# Patient Record
Sex: Male | Born: 1985 | Race: Black or African American | Hispanic: No | Marital: Single | Smoking: Current every day smoker
Health system: Southern US, Community
[De-identification: ages and names within clinical notes are randomized; demographics above are authoritative.]

## PROBLEM LIST (undated history)

## (undated) DIAGNOSIS — F1010 Alcohol abuse, uncomplicated: Secondary | ICD-10-CM

## (undated) DIAGNOSIS — S129XXA Fracture of neck, unspecified, initial encounter: Secondary | ICD-10-CM

---

## 2010-02-01 ENCOUNTER — Encounter: Admission: RE | Admit: 2010-02-01 | Discharge: 2010-02-01 | Payer: Self-pay | Admitting: Infectious Diseases

## 2010-02-01 IMAGING — CR DG CHEST 2V
2 series · 2 of 2 positions shown · non-contrast
Comparison: None.

CLINICAL DATA: Asymptomatic positive PPD.

CHEST - 2 VIEW [DATE]:

[view not recorded (1 of 2)]
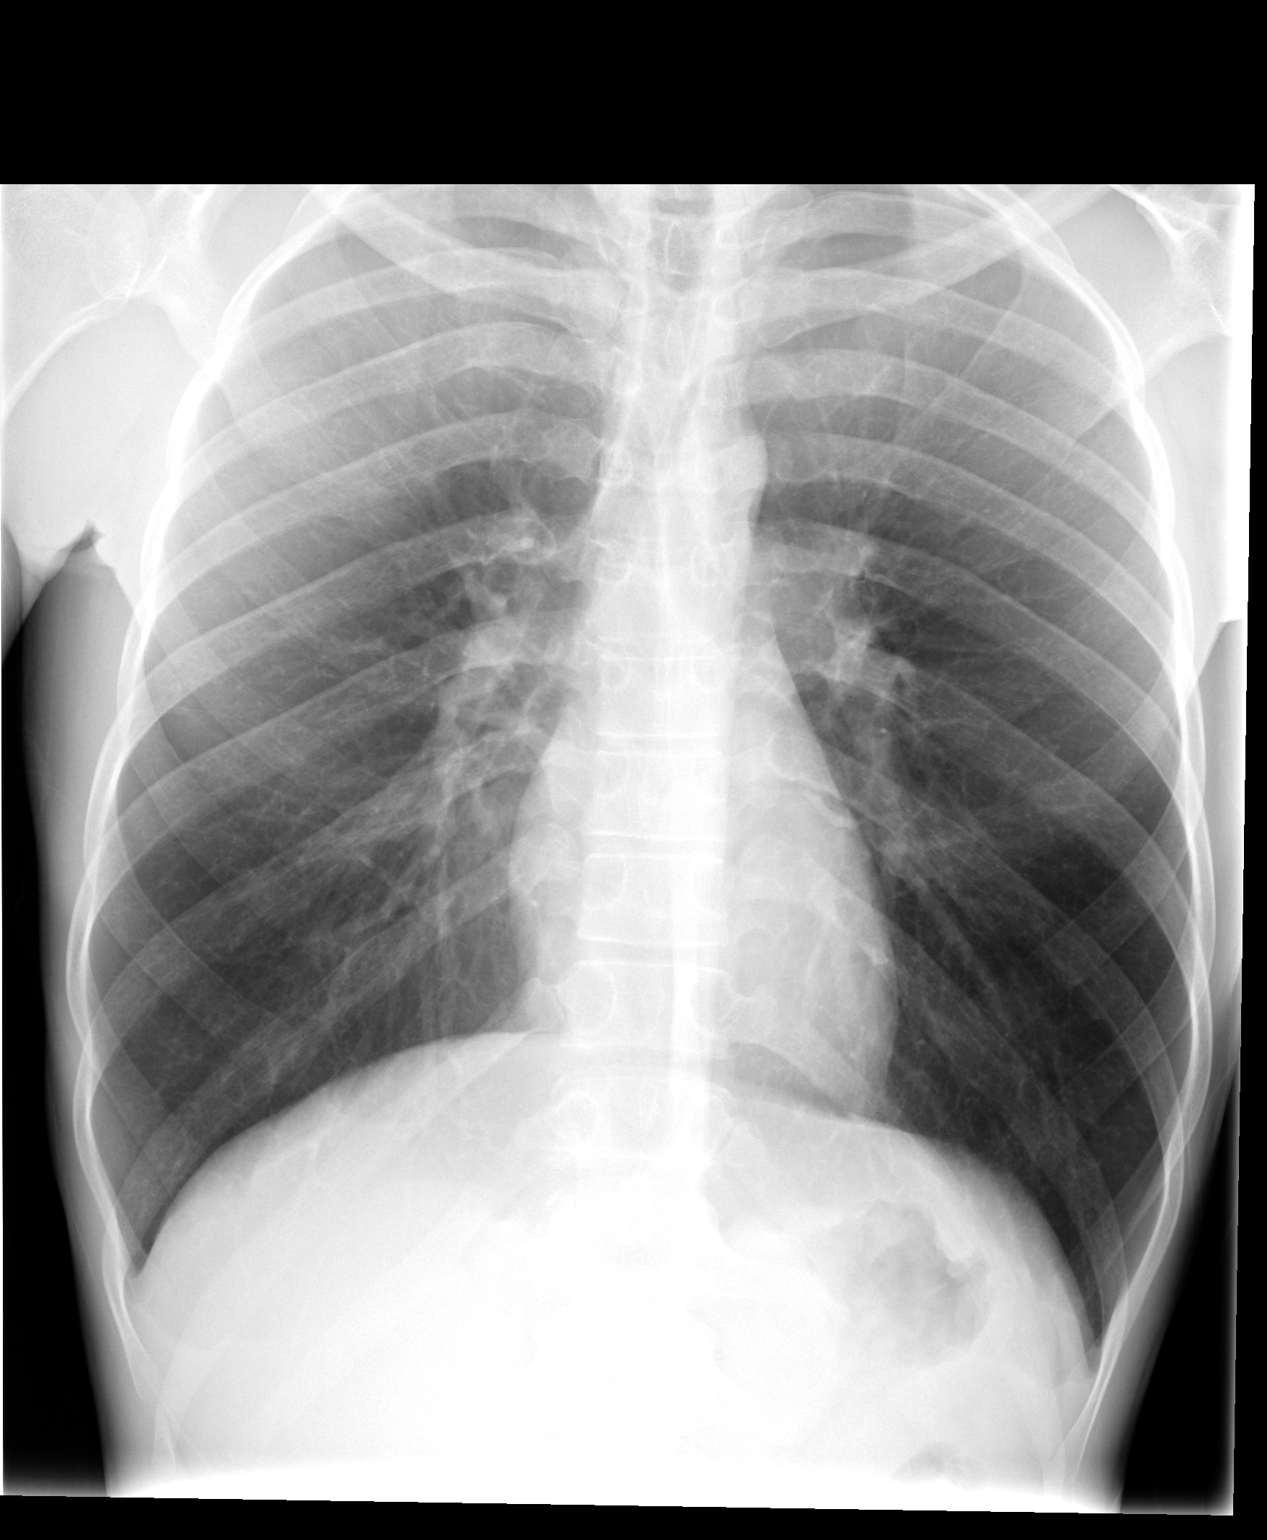

[view not recorded (2 of 2)]
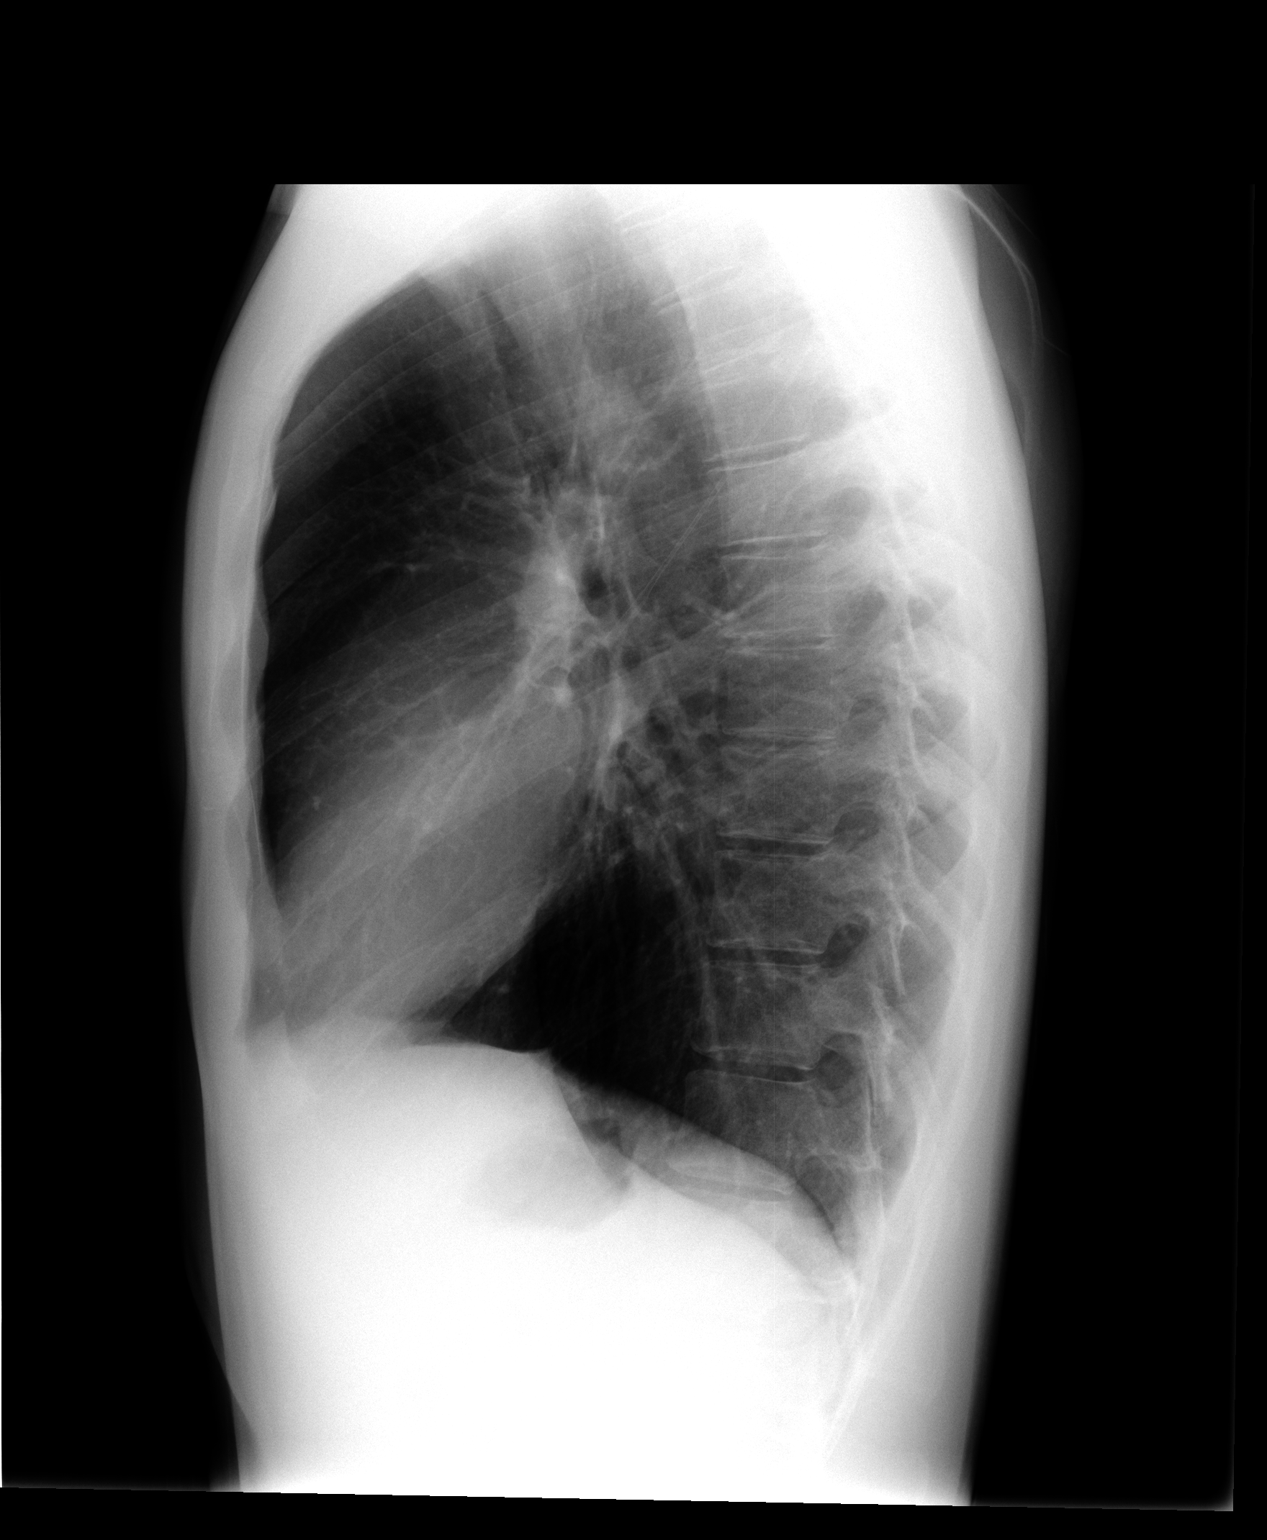

[2 of 2 positions shown; findings below may reference images not displayed]

FINDINGS: Cardiomediastinal silhouette unremarkable.  Lungs clear.
Bronchovascular markings normal.  Pulmonary vascularity normal.  No
pleural effusions.  No pneumothorax.  Visualized bony thorax
intact.
IMPRESSION: Normal chest x-ray.

## 2016-08-19 ENCOUNTER — Encounter (HOSPITAL_COMMUNITY): Payer: Self-pay | Admitting: Emergency Medicine

## 2016-08-19 ENCOUNTER — Other Ambulatory Visit: Payer: Self-pay | Admitting: Neurosurgery

## 2016-08-19 ENCOUNTER — Inpatient Hospital Stay (HOSPITAL_COMMUNITY)
Admission: EM | Admit: 2016-08-19 | Discharge: 2016-08-23 | DRG: 472 | Disposition: A | Discharge status: Home / Self Care | Payer: No Typology Code available for payment source | Attending: Neurosurgery | Admitting: Neurosurgery

## 2016-08-19 ENCOUNTER — Emergency Department (HOSPITAL_COMMUNITY): Payer: No Typology Code available for payment source

## 2016-08-19 ENCOUNTER — Inpatient Hospital Stay (HOSPITAL_COMMUNITY): Payer: No Typology Code available for payment source

## 2016-08-19 DIAGNOSIS — S12400A Unspecified displaced fracture of fifth cervical vertebra, initial encounter for closed fracture: Secondary | ICD-10-CM | POA: Diagnosis present

## 2016-08-19 DIAGNOSIS — T148XXA Other injury of unspecified body region, initial encounter: Secondary | ICD-10-CM

## 2016-08-19 DIAGNOSIS — S129XXA Fracture of neck, unspecified, initial encounter: Secondary | ICD-10-CM | POA: Diagnosis present

## 2016-08-19 DIAGNOSIS — S0083XA Contusion of other part of head, initial encounter: Secondary | ICD-10-CM

## 2016-08-19 DIAGNOSIS — D62 Acute posthemorrhagic anemia: Secondary | ICD-10-CM | POA: Diagnosis present

## 2016-08-19 DIAGNOSIS — Y92410 Unspecified street and highway as the place of occurrence of the external cause: Secondary | ICD-10-CM

## 2016-08-19 DIAGNOSIS — Y908 Blood alcohol level of 240 mg/100 ml or more: Secondary | ICD-10-CM | POA: Diagnosis present

## 2016-08-19 DIAGNOSIS — F1010 Alcohol abuse, uncomplicated: Secondary | ICD-10-CM | POA: Diagnosis present

## 2016-08-19 DIAGNOSIS — N289 Disorder of kidney and ureter, unspecified: Secondary | ICD-10-CM

## 2016-08-19 DIAGNOSIS — S0081XA Abrasion of other part of head, initial encounter: Secondary | ICD-10-CM | POA: Diagnosis present

## 2016-08-19 DIAGNOSIS — S12300A Unspecified displaced fracture of fourth cervical vertebra, initial encounter for closed fracture: Secondary | ICD-10-CM | POA: Diagnosis present

## 2016-08-19 DIAGNOSIS — F10129 Alcohol abuse with intoxication, unspecified: Secondary | ICD-10-CM | POA: Diagnosis present

## 2016-08-19 DIAGNOSIS — S129XXD Fracture of neck, unspecified, subsequent encounter: Secondary | ICD-10-CM

## 2016-08-19 DIAGNOSIS — S0181XA Laceration without foreign body of other part of head, initial encounter: Secondary | ICD-10-CM | POA: Diagnosis present

## 2016-08-19 DIAGNOSIS — R29898 Other symptoms and signs involving the musculoskeletal system: Secondary | ICD-10-CM

## 2016-08-19 DIAGNOSIS — M5412 Radiculopathy, cervical region: Secondary | ICD-10-CM | POA: Diagnosis present

## 2016-08-19 HISTORY — DX: Fracture of neck, unspecified, initial encounter: S12.9XXA

## 2016-08-19 LAB — URINALYSIS, ROUTINE W REFLEX MICROSCOPIC
Bacteria, UA: NONE SEEN
Bilirubin Urine: NEGATIVE
Glucose, UA: NEGATIVE mg/dL
Hgb urine dipstick: NEGATIVE
Ketones, ur: 20 mg/dL — AB
Leukocytes, UA: NEGATIVE
Nitrite: NEGATIVE
Protein, ur: NEGATIVE mg/dL
Specific Gravity, Urine: >1.046 — ABNORMAL HIGH (ref 1.005–1.030)
Squamous Epithelial / LPF: NONE SEEN
pH: 6 (ref 5.0–8.0)

## 2016-08-19 LAB — PROTIME-INR
INR: 1.07
PROTHROMBIN TIME: 13.9 s (ref 11.4–15.2)

## 2016-08-19 LAB — CBC
HCT: 41.5 % (ref 39.0–52.0)
HCT: 37.4 % — ABNORMAL LOW (ref 39.0–52.0)
HCT: 37.9 % — ABNORMAL LOW (ref 39.0–52.0)
HEMOGLOBIN: 13 g/dL (ref 13.0–17.0)
Hemoglobin: 15 g/dL (ref 13.0–17.0)
Hemoglobin: 13.1 g/dL (ref 13.0–17.0)
MCH: 31.1 pg (ref 26.0–34.0)
MCH: 29.4 pg (ref 26.0–34.0)
MCH: 29.7 pg (ref 26.0–34.0)
MCHC: 36.1 g/dL — ABNORMAL HIGH (ref 30.0–36.0)
MCHC: 34.8 g/dL (ref 30.0–36.0)
MCHC: 34.6 g/dL (ref 30.0–36.0)
MCV: 85.9 fL (ref 78.0–100.0)
MCV: 84.6 fL (ref 78.0–100.0)
MCV: 85.9 fL (ref 78.0–100.0)
PLATELETS: 151 10*3/uL (ref 150–400)
PLATELETS: 139 10*3/uL — AB (ref 150–400)
Platelets: 102 10*3/uL — ABNORMAL LOW (ref 150–400)
RBC: 4.83 MIL/uL (ref 4.22–5.81)
RBC: 4.42 MIL/uL (ref 4.22–5.81)
RBC: 4.41 MIL/uL (ref 4.22–5.81)
RDW: 12.9 % (ref 11.5–15.5)
RDW: 12.6 % (ref 11.5–15.5)
RDW: 12.9 % (ref 11.5–15.5)
WBC: 8.1 10*3/uL (ref 4.0–10.5)
WBC: 10.8 10*3/uL — ABNORMAL HIGH (ref 4.0–10.5)
WBC: 8.6 10*3/uL (ref 4.0–10.5)

## 2016-08-19 LAB — BASIC METABOLIC PANEL
Anion gap: 13 (ref 5–15)
BUN: 16 mg/dL (ref 6–20)
CALCIUM: 9.5 mg/dL (ref 8.9–10.3)
CHLORIDE: 112 mmol/L — AB (ref 101–111)
CO2: 19 mmol/L — ABNORMAL LOW (ref 22–32)
CREATININE: 1.24 mg/dL (ref 0.61–1.24)
GFR calc non Af Amer: >60 mL/min (ref >60)
Glucose, Bld: 93 mg/dL (ref 65–99)
Potassium: 4.2 mmol/L (ref 3.5–5.1)
SODIUM: 144 mmol/L (ref 135–145)

## 2016-08-19 LAB — I-STAT CHEM 8, ED
BUN: 20 mg/dL (ref 6–20)
Calcium, Ion: 1.08 mmol/L — ABNORMAL LOW (ref 1.15–1.40)
Chloride: 104 mmol/L (ref 101–111)
Creatinine, Ser: 1.7 mg/dL — ABNORMAL HIGH (ref 0.61–1.24)
Glucose, Bld: 101 mg/dL — ABNORMAL HIGH (ref 65–99)
HCT: 45 % (ref 39.0–52.0)
Hemoglobin: 15.3 g/dL (ref 13.0–17.0)
Potassium: 3.9 mmol/L (ref 3.5–5.1)
Sodium: 145 mmol/L (ref 135–145)
TCO2: 26 mmol/L (ref 0–100)

## 2016-08-19 LAB — COMPREHENSIVE METABOLIC PANEL
ALT: 59 U/L (ref 17–63)
AST: 181 U/L — ABNORMAL HIGH (ref 15–41)
Albumin: 4.2 g/dL (ref 3.5–5.0)
Alkaline Phosphatase: 68 U/L (ref 38–126)
Anion gap: 16 — ABNORMAL HIGH (ref 5–15)
BUN: 16 mg/dL (ref 6–20)
CO2: 21 mmol/L — ABNORMAL LOW (ref 22–32)
Calcium: 9.3 mg/dL (ref 8.9–10.3)
Chloride: 105 mmol/L (ref 101–111)
Creatinine, Ser: 1.43 mg/dL — ABNORMAL HIGH (ref 0.61–1.24)
GFR calc Af Amer: 33 mL/min — ABNORMAL LOW (ref >60)
GFR calc non Af Amer: 29 mL/min — ABNORMAL LOW (ref >60)
Glucose, Bld: 95 mg/dL (ref 65–99)
Potassium: 3.8 mmol/L (ref 3.5–5.1)
Sodium: 142 mmol/L (ref 135–145)
Total Bilirubin: 0.4 mg/dL (ref 0.3–1.2)
Total Protein: 7.3 g/dL (ref 6.5–8.1)

## 2016-08-19 LAB — I-STAT CG4 LACTIC ACID, ED: Lactic Acid, Venous: 5.22 mmol/L (ref 0.5–1.9)

## 2016-08-19 LAB — CDS SEROLOGY

## 2016-08-19 LAB — ETHANOL: Alcohol, Ethyl (B): 240 mg/dL — ABNORMAL HIGH (ref <5)

## 2016-08-19 MED ORDER — LIDOCAINE-EPINEPHRINE 1 %-1:100000 IJ SOLN
20.0000 mL | Freq: Once | INTRAMUSCULAR | Status: AC
  Administered 2016-08-19: 20 mL via INTRADERMAL
  Filled 2016-08-19: qty 20

## 2016-08-19 MED ORDER — LIDOCAINE-EPINEPHRINE (PF) 2 %-1:200000 IJ SOLN
INTRAMUSCULAR | Status: AC | PRN
  Administered 2016-08-19: 20 mL

## 2016-08-19 MED ORDER — KCL IN DEXTROSE-NACL 20-5-0.45 MEQ/L-%-% IV SOLN
INTRAVENOUS | Status: DC
  Administered 2016-08-19 – 2016-08-21 (×2): via INTRAVENOUS
  Filled 2016-08-19 (×7): qty 1000

## 2016-08-19 MED ORDER — MORPHINE SULFATE (PF) 4 MG/ML IV SOLN
INTRAVENOUS | Status: AC
Start: 2016-08-19 — End: 2016-08-19
  Filled 2016-08-19: qty 1

## 2016-08-19 MED ORDER — MORPHINE SULFATE 2 MG/ML IJ SOLN
INTRAMUSCULAR | Status: AC | PRN
  Administered 2016-08-19: 4 mg via INTRAVENOUS

## 2016-08-19 MED ORDER — CHLORHEXIDINE GLUCONATE CLOTH 2 % EX PADS
6.0000 | MEDICATED_PAD | Freq: Once | CUTANEOUS | Status: AC
  Administered 2016-08-19: 6 via TOPICAL

## 2016-08-19 MED ORDER — ONDANSETRON HCL 4 MG/2ML IJ SOLN
4.0000 mg | Freq: Four times a day (QID) | INTRAMUSCULAR | Status: DC | PRN
  Administered 2016-08-20: 4 mg via INTRAVENOUS

## 2016-08-19 MED ORDER — HYDROMORPHONE HCL 2 MG/ML IJ SOLN
0.5000 mg | INTRAMUSCULAR | Status: DC | PRN
  Administered 2016-08-19: 0.5 mg via INTRAVENOUS
  Filled 2016-08-19: qty 1

## 2016-08-19 MED ORDER — SODIUM CHLORIDE 0.9 % IV SOLN
INTRAVENOUS | Status: AC | PRN
  Administered 2016-08-19: 1000 mL via INTRAVENOUS

## 2016-08-19 MED ORDER — ONDANSETRON HCL 4 MG PO TABS: 4.0000 mg | ORAL_TABLET | Freq: Four times a day (QID) | ORAL | Status: DC | PRN

## 2016-08-19 MED ORDER — PANTOPRAZOLE SODIUM 40 MG IV SOLR
40.0000 mg | Freq: Every day | INTRAVENOUS | Status: DC
  Administered 2016-08-19: 40 mg via INTRAVENOUS
  Filled 2016-08-19 (×2): qty 40

## 2016-08-19 MED ORDER — HYDROMORPHONE HCL 2 MG/ML IJ SOLN
1.0000 mg | INTRAMUSCULAR | Status: DC | PRN
  Administered 2016-08-19: 1 mg via INTRAVENOUS
  Filled 2016-08-19: qty 1

## 2016-08-19 MED ORDER — PANTOPRAZOLE SODIUM 40 MG PO TBEC
40.0000 mg | DELAYED_RELEASE_TABLET | Freq: Every day | ORAL | Status: DC
  Administered 2016-08-21 – 2016-08-23 (×3): 40 mg via ORAL
  Filled 2016-08-19 (×3): qty 1

## 2016-08-19 MED ORDER — HYDROMORPHONE HCL 1 MG/ML IJ SOLN: 1.0000 mg | INTRAMUSCULAR | Status: DC | PRN

## 2016-08-19 MED ORDER — LIDOCAINE-EPINEPHRINE (PF) 2 %-1:200000 IJ SOLN
20.0000 mL | Freq: Once | INTRAMUSCULAR | Status: AC
  Administered 2016-08-19: 20 mL via INTRADERMAL

## 2016-08-19 MED ORDER — HYDROMORPHONE HCL 1 MG/ML IJ SOLN
0.5000 mg | INTRAMUSCULAR | Status: DC | PRN
  Administered 2016-08-19: 0.5 mg via INTRAVENOUS
  Filled 2016-08-19: qty 1

## 2016-08-19 MED ORDER — CEFAZOLIN SODIUM-DEXTROSE 2-4 GM/100ML-% IV SOLN
2.0000 g | INTRAVENOUS | Status: AC
  Administered 2016-08-20: 2 g via INTRAVENOUS
  Filled 2016-08-19: qty 100

## 2016-08-19 MED ORDER — TETANUS-DIPHTH-ACELL PERTUSSIS 5-2.5-18.5 LF-MCG/0.5 IM SUSP
0.5000 mL | Freq: Once | INTRAMUSCULAR | Status: AC
  Administered 2016-08-19: 0.5 mL via INTRAMUSCULAR
  Filled 2016-08-19: qty 0.5

## 2016-08-19 MED ORDER — IOPAMIDOL (ISOVUE-300) INJECTION 61%
INTRAVENOUS | Status: AC
  Administered 2016-08-19: 100 mL
  Filled 2016-08-19: qty 100

## 2016-08-19 MED ORDER — LIDOCAINE HCL (PF) 1 % IJ SOLN
INTRAMUSCULAR | Status: AC
  Filled 2016-08-19: qty 5

## 2016-08-19 MED ORDER — MORPHINE SULFATE (PF) 4 MG/ML IV SOLN
4.0000 mg | Freq: Once | INTRAVENOUS | Status: DC
  Filled 2016-08-19: qty 1

## 2016-08-19 MED ORDER — BACITRACIN-NEOMYCIN-POLYMYXIN OINTMENT TUBE
1.0000 "application " | TOPICAL_OINTMENT | Freq: Two times a day (BID) | CUTANEOUS | Status: DC
  Administered 2016-08-19 – 2016-08-23 (×8): 1 via TOPICAL
  Filled 2016-08-19: qty 14.17

## 2016-08-19 MED ORDER — LIDOCAINE-EPINEPHRINE (PF) 2 %-1:200000 IJ SOLN
10.0000 mL | Freq: Once | INTRAMUSCULAR | Status: DC
  Filled 2016-08-19: qty 20

## 2016-08-19 MED ORDER — LIDOCAINE HCL (PF) 1 % IJ SOLN
INTRAMUSCULAR | Status: AC
  Filled 2016-08-19: qty 30

## 2016-08-19 NOTE — ED Notes (Addendum)
ENT remains at bedside suturing patient. Meds will be given once MD is finished.
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
## 2016-08-19 NOTE — ED Notes (Signed)
Call made to [phone] [facility] father Ryan Stark
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
## 2016-08-19 NOTE — ED Notes (Signed)
Trauma Start 

## 2016-08-19 NOTE — ED Notes (Signed)
Neurosurgeon at bedside °

## 2016-08-19 NOTE — ED Notes (Signed)
Patient transported to X-ray 
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
## 2016-08-19 NOTE — ED Notes (Signed)
Pt gave number [phone] and [phone] for his father and asked us to contact him. First number a voicemail was left second number is disconnected.

## 2016-08-19 NOTE — Anesthesia Preprocedure Evaluation (Addendum)
Anesthesia Evaluation  Patient identified by MRN, date of birth, ID band Patient awake    Reviewed: Allergy & Precautions, NPO status , Patient's Chart, lab work & pertinent test results  History of Anesthesia Complications Negative for: history of anesthetic complications  Airway Mallampati: III  TM Distance: >3 FB Neck ROM: Limited  Mouth opening: Limited Mouth Opening  Dental  (+) Dental Advisory Given   Pulmonary neg pulmonary ROS,    breath sounds clear to auscultation       Cardiovascular negative cardio ROS   Rhythm:Regular Rate:Normal     Neuro/Psych  C-spine not cleared    GI/Hepatic negative GI ROS, Neg liver ROS,   Endo/Other  negative endocrine ROS  Renal/GU negative Renal ROS     Musculoskeletal   Abdominal   Peds  Hematology negative hematology ROS (+)   Anesthesia Other Findings   Reproductive/Obstetrics                             Anesthesia Physical Anesthesia Plan  ASA: I  Anesthesia Plan: General   Post-op Pain Management:    Induction: Intravenous  Airway Management Planned: Oral ETT and Video Laryngoscope Planned  Additional Equipment:   Intra-op Plan:   Post-operative Plan: Possible Post-op intubation/ventilation  Informed Consent: I have reviewed the patients History and Physical, chart, labs and discussed the procedure including the risks, benefits and alternatives for the proposed anesthesia with the patient or authorized representative who has indicated his/her understanding and acceptance.   Dental advisory given  Plan Discussed with: CRNA and Surgeon  Anesthesia Plan Comments: (Plan routine monitors, GETA with VideoGlide intubation)        Anesthesia Quick Evaluation
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
## 2016-08-19 NOTE — ED Notes (Signed)
ENT at bedside with patient. Dr. Shearon StallsNoodleman's office called and requests father of patient to be at bedside at 0815 tomorrow morning to discuss surgery on Thursday. Father has been informed and will be at bedside in the morning.
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
## 2016-08-20 ENCOUNTER — Inpatient Hospital Stay (HOSPITAL_COMMUNITY): Payer: No Typology Code available for payment source

## 2016-08-20 ENCOUNTER — Inpatient Hospital Stay (HOSPITAL_COMMUNITY): Payer: No Typology Code available for payment source | Admitting: Certified Registered Nurse Anesthetist

## 2016-08-20 ENCOUNTER — Encounter (HOSPITAL_COMMUNITY): Admission: EM | Disposition: A | Discharge status: Home / Self Care | Payer: Self-pay | Source: Home / Self Care

## 2016-08-20 HISTORY — PX: ANTERIOR CERVICAL DECOMP/DISCECTOMY FUSION: SHX1161

## 2016-08-20 LAB — SAMPLE TO BLOOD BANK

## 2016-08-20 LAB — SURGICAL PCR SCREEN
MRSA, PCR: NEGATIVE
Staphylococcus aureus: POSITIVE — AB

## 2016-08-20 SURGERY — ANTERIOR CERVICAL DECOMPRESSION/DISCECTOMY FUSION 1 LEVEL
Anesthesia: General

## 2016-08-20 MED ORDER — HYDROCODONE-ACETAMINOPHEN 5-325 MG PO TABS
1.0000 | ORAL_TABLET | ORAL | Status: DC | PRN
  Administered 2016-08-21 – 2016-08-23 (×5): 2 via ORAL
  Filled 2016-08-20 (×5): qty 2

## 2016-08-20 MED ORDER — THROMBIN 5000 UNITS EX SOLR
CUTANEOUS | Status: AC
  Filled 2016-08-20: qty 15000

## 2016-08-20 MED ORDER — MIDAZOLAM HCL 2 MG/2ML IJ SOLN
INTRAMUSCULAR | Status: AC
  Filled 2016-08-20: qty 2

## 2016-08-20 MED ORDER — GLYCOPYRROLATE 0.2 MG/ML IJ SOLN
INTRAMUSCULAR | Status: DC | PRN
  Administered 2016-08-20: 0.2 mg via INTRAVENOUS

## 2016-08-20 MED ORDER — 0.9 % SODIUM CHLORIDE (POUR BTL) OPTIME
TOPICAL | Status: DC | PRN
  Administered 2016-08-20: 1000 mL

## 2016-08-20 MED ORDER — LACTATED RINGERS IV SOLN
INTRAVENOUS | Status: DC | PRN
  Administered 2016-08-20: 08:00:00 via INTRAVENOUS

## 2016-08-20 MED ORDER — KETOROLAC TROMETHAMINE 30 MG/ML IJ SOLN
30.0000 mg | Freq: Once | INTRAMUSCULAR | Status: AC
  Administered 2016-08-20: 30 mg via INTRAVENOUS

## 2016-08-20 MED ORDER — PROPOFOL 10 MG/ML IV BOLUS
INTRAVENOUS | Status: AC
  Filled 2016-08-20: qty 20

## 2016-08-20 MED ORDER — HEMOSTATIC AGENTS (NO CHARGE) OPTIME
TOPICAL | Status: DC | PRN
  Administered 2016-08-20: 1 via TOPICAL

## 2016-08-20 MED ORDER — PROPOFOL 10 MG/ML IV BOLUS
INTRAVENOUS | Status: DC | PRN
  Administered 2016-08-20: 120 mg via INTRAVENOUS

## 2016-08-20 MED ORDER — PHENYLEPHRINE HCL 10 MG/ML IJ SOLN
INTRAMUSCULAR | Status: DC | PRN
  Administered 2016-08-20: 30 ug/min via INTRAVENOUS

## 2016-08-20 MED ORDER — SODIUM CHLORIDE 0.9 % IR SOLN
Status: DC | PRN
  Administered 2016-08-20: 09:00:00

## 2016-08-20 MED ORDER — PROMETHAZINE HCL 25 MG/ML IJ SOLN
6.2500 mg | INTRAMUSCULAR | Status: DC | PRN
Start: 2016-08-20 — End: 2016-08-20

## 2016-08-20 MED ORDER — THROMBIN 5000 UNITS EX SOLR
OROMUCOSAL | Status: DC | PRN
  Administered 2016-08-20: 09:00:00 via TOPICAL

## 2016-08-20 MED ORDER — EPHEDRINE 5 MG/ML INJ
INTRAVENOUS | Status: AC
  Filled 2016-08-20: qty 10

## 2016-08-20 MED ORDER — LIDOCAINE-EPINEPHRINE (PF) 2 %-1:200000 IJ SOLN
INTRAMUSCULAR | Status: DC | PRN
  Administered 2016-08-20: 5 mL via INTRADERMAL

## 2016-08-20 MED ORDER — MIDAZOLAM HCL 5 MG/5ML IJ SOLN
INTRAMUSCULAR | Status: DC | PRN
Start: 2016-08-20 — End: 2016-08-20
  Administered 2016-08-20: 2 mg via INTRAVENOUS

## 2016-08-20 MED ORDER — MEPERIDINE HCL 25 MG/ML IJ SOLN: 6.2500 mg | INTRAMUSCULAR | Status: DC | PRN

## 2016-08-20 MED ORDER — FENTANYL CITRATE (PF) 100 MCG/2ML IJ SOLN
INTRAMUSCULAR | Status: DC | PRN
  Administered 2016-08-20: 200 ug via INTRAVENOUS
  Administered 2016-08-20 (×3): 50 ug via INTRAVENOUS

## 2016-08-20 MED ORDER — THROMBIN 5000 UNITS EX SOLR
CUTANEOUS | Status: AC
  Filled 2016-08-20: qty 10000

## 2016-08-20 MED ORDER — PHENYLEPHRINE 40 MCG/ML (10ML) SYRINGE FOR IV PUSH (FOR BLOOD PRESSURE SUPPORT)
PREFILLED_SYRINGE | INTRAVENOUS | Status: AC
  Filled 2016-08-20: qty 20

## 2016-08-20 MED ORDER — FENTANYL CITRATE (PF) 100 MCG/2ML IJ SOLN
INTRAMUSCULAR | Status: AC
  Filled 2016-08-20: qty 4

## 2016-08-20 MED ORDER — BUPIVACAINE HCL (PF) 0.5 % IJ SOLN
INTRAMUSCULAR | Status: DC | PRN
  Administered 2016-08-20: 5 mL

## 2016-08-20 MED ORDER — MIDAZOLAM HCL 2 MG/2ML IJ SOLN
0.5000 mg | Freq: Once | INTRAMUSCULAR | Status: DC | PRN
Start: 2016-08-20 — End: 2016-08-20

## 2016-08-20 MED ORDER — ROCURONIUM BROMIDE 50 MG/5ML IV SOSY
PREFILLED_SYRINGE | INTRAVENOUS | Status: AC
  Filled 2016-08-20: qty 15

## 2016-08-20 MED ORDER — ACETAMINOPHEN 10 MG/ML IV SOLN
INTRAVENOUS | Status: DC | PRN
  Administered 2016-08-20: 1000 mg via INTRAVENOUS

## 2016-08-20 MED ORDER — HYDROMORPHONE HCL 1 MG/ML IJ SOLN: 0.2500 mg | INTRAMUSCULAR | Status: DC | PRN

## 2016-08-20 MED ORDER — FENTANYL CITRATE (PF) 100 MCG/2ML IJ SOLN
INTRAMUSCULAR | Status: AC
  Filled 2016-08-20: qty 2

## 2016-08-20 MED ORDER — MORPHINE SULFATE (PF) 4 MG/ML IV SOLN
4.0000 mg | INTRAVENOUS | Status: DC | PRN
  Administered 2016-08-20 – 2016-08-21 (×2): 4 mg via INTRAMUSCULAR
  Filled 2016-08-20 (×2): qty 1

## 2016-08-20 MED ORDER — BACITRACIN ZINC 500 UNIT/GM EX OINT
TOPICAL_OINTMENT | CUTANEOUS | Status: AC
  Filled 2016-08-20: qty 28.35

## 2016-08-20 MED ORDER — ROCURONIUM BROMIDE 100 MG/10ML IV SOLN
INTRAVENOUS | Status: DC | PRN
  Administered 2016-08-20: 50 mg via INTRAVENOUS
  Administered 2016-08-20: 10 mg via INTRAVENOUS

## 2016-08-20 MED ORDER — PHENYLEPHRINE HCL 10 MG/ML IJ SOLN
INTRAMUSCULAR | Status: DC | PRN
  Administered 2016-08-20: 80 ug via INTRAVENOUS
  Administered 2016-08-20 (×2): 100 ug via INTRAVENOUS
  Administered 2016-08-20: 40 ug via INTRAVENOUS

## 2016-08-20 MED ORDER — LIDOCAINE-EPINEPHRINE (PF) 2 %-1:200000 IJ SOLN
INTRAMUSCULAR | Status: AC
  Filled 2016-08-20: qty 20

## 2016-08-20 MED ORDER — LIDOCAINE HCL (CARDIAC) 20 MG/ML IV SOLN
INTRAVENOUS | Status: DC | PRN
  Administered 2016-08-20: 20 mg via INTRAVENOUS

## 2016-08-20 MED ORDER — DEXAMETHASONE SODIUM PHOSPHATE 4 MG/ML IJ SOLN
INTRAMUSCULAR | Status: DC | PRN
  Administered 2016-08-20: 10 mg via INTRAVENOUS

## 2016-08-20 MED ORDER — VANCOMYCIN HCL 1000 MG IV SOLR
INTRAVENOUS | Status: AC
  Filled 2016-08-20: qty 1000

## 2016-08-20 MED ORDER — THROMBIN 5000 UNITS EX SOLR
CUTANEOUS | Status: DC | PRN
  Administered 2016-08-20: 10000 [IU] via TOPICAL
  Administered 2016-08-20 (×2): 5000 [IU] via TOPICAL

## 2016-08-20 MED ORDER — LIDOCAINE 2% (20 MG/ML) 5 ML SYRINGE
INTRAMUSCULAR | Status: AC
  Filled 2016-08-20: qty 5

## 2016-08-20 MED ORDER — SUGAMMADEX SODIUM 200 MG/2ML IV SOLN
INTRAVENOUS | Status: DC | PRN
  Administered 2016-08-20: 150 mg via INTRAVENOUS

## 2016-08-20 MED ORDER — KETOROLAC TROMETHAMINE 30 MG/ML IJ SOLN
INTRAMUSCULAR | Status: AC
  Filled 2016-08-20: qty 1

## 2016-08-20 SURGICAL SUPPLY — 57 items
ALLOGRAFT 7X14X11 (Bone Implant) ×2 IMPLANT
BAG DECANTER FOR FLEXI CONT (MISCELLANEOUS) ×2 IMPLANT
BIT DRILL 14X2.5XNS TI ANT (BIT) ×1 IMPLANT
BIT DRILL AVIATOR 14 (BIT) ×1
BIT DRILL NEURO 2X3.1 SFT TUCH (MISCELLANEOUS) ×1 IMPLANT
BIT DRL 14X2.5XNS TI ANT (BIT) ×1
BLADE ULTRA TIP 2M (BLADE) ×2 IMPLANT
CANISTER SUCT 3000ML PPV (MISCELLANEOUS) ×2 IMPLANT
CARTRIDGE OIL MAESTRO DRILL (MISCELLANEOUS) ×1 IMPLANT
COVER MAYO STAND STRL (DRAPES) ×2 IMPLANT
DECANTER SPIKE VIAL GLASS SM (MISCELLANEOUS) ×2 IMPLANT
DERMABOND ADVANCED (GAUZE/BANDAGES/DRESSINGS) ×1
DERMABOND ADVANCED .7 DNX12 (GAUZE/BANDAGES/DRESSINGS) ×1 IMPLANT
DIFFUSER DRILL AIR PNEUMATIC (MISCELLANEOUS) ×2 IMPLANT
DRAPE HALF SHEET 40X57 (DRAPES) ×2 IMPLANT
DRAPE LAPAROTOMY 100X72 PEDS (DRAPES) ×2 IMPLANT
DRAPE MICROSCOPE LEICA (MISCELLANEOUS) ×2 IMPLANT
DRAPE POUCH INSTRU U-SHP 10X18 (DRAPES) ×4 IMPLANT
DRILL NEURO 2X3.1 SOFT TOUCH (MISCELLANEOUS) ×2
ELECT COATED BLADE 2.86 ST (ELECTRODE) ×2 IMPLANT
ELECT REM PT RETURN 9FT ADLT (ELECTROSURGICAL) ×2
ELECTRODE REM PT RTRN 9FT ADLT (ELECTROSURGICAL) ×1 IMPLANT
GLOVE BIOGEL PI IND STRL 7.5 (GLOVE) ×1 IMPLANT
GLOVE BIOGEL PI IND STRL 8 (GLOVE) ×1 IMPLANT
GLOVE BIOGEL PI INDICATOR 7.5 (GLOVE) ×1
GLOVE BIOGEL PI INDICATOR 8 (GLOVE) ×1
GLOVE ECLIPSE 7.5 STRL STRAW (GLOVE) ×4 IMPLANT
GLOVE EXAM NITRILE LRG STRL (GLOVE) IMPLANT
GLOVE EXAM NITRILE XL STR (GLOVE) IMPLANT
GLOVE EXAM NITRILE XS STR PU (GLOVE) IMPLANT
GOWN STRL REUS W/ TWL LRG LVL3 (GOWN DISPOSABLE) ×1 IMPLANT
GOWN STRL REUS W/ TWL XL LVL3 (GOWN DISPOSABLE) ×3 IMPLANT
GOWN STRL REUS W/TWL 2XL LVL3 (GOWN DISPOSABLE) IMPLANT
GOWN STRL REUS W/TWL LRG LVL3 (GOWN DISPOSABLE) ×1
GOWN STRL REUS W/TWL XL LVL3 (GOWN DISPOSABLE) ×3
HALTER HD/CHIN CERV TRACTION D (MISCELLANEOUS) ×2 IMPLANT
HEMOSTAT POWDER KIT SURGIFOAM (HEMOSTASIS) ×2 IMPLANT
KIT BASIN OR (CUSTOM PROCEDURE TRAY) ×2 IMPLANT
KIT ROOM TURNOVER OR (KITS) ×2 IMPLANT
NEEDLE HYPO 25X1 1.5 SAFETY (NEEDLE) ×2 IMPLANT
NEEDLE SPNL 22GX3.5 QUINCKE BK (NEEDLE) ×2 IMPLANT
NS IRRIG 1000ML POUR BTL (IV SOLUTION) ×2 IMPLANT
OIL CARTRIDGE MAESTRO DRILL (MISCELLANEOUS) ×2
PACK LAMINECTOMY NEURO (CUSTOM PROCEDURE TRAY) ×2 IMPLANT
PAD ARMBOARD 7.5X6 YLW CONV (MISCELLANEOUS) ×6 IMPLANT
PATTIES SURGICAL .5 X.5 (GAUZE/BANDAGES/DRESSINGS) ×2 IMPLANT
PLATE AVIATOR ASSY 1LVL SZ 12 (Plate) ×2 IMPLANT
RUBBERBAND STERILE (MISCELLANEOUS) ×4 IMPLANT
SCREW AVIATOR VAR SELFTAP 4X14 (Screw) ×8 IMPLANT
SPONGE INTESTINAL PEANUT (DISPOSABLE) ×2 IMPLANT
SPONGE SURGIFOAM ABS GEL SZ50 (HEMOSTASIS) ×4 IMPLANT
STAPLER SKIN PROX WIDE 3.9 (STAPLE) IMPLANT
SUT VIC AB 2-0 CP2 18 (SUTURE) ×2 IMPLANT
SUT VIC AB 3-0 SH 8-18 (SUTURE) ×4 IMPLANT
TOWEL OR 17X24 6PK STRL BLUE (TOWEL DISPOSABLE) ×2 IMPLANT
TOWEL OR 17X26 10 PK STRL BLUE (TOWEL DISPOSABLE) ×2 IMPLANT
WATER STERILE IRR 1000ML POUR (IV SOLUTION) ×2 IMPLANT

## 2016-08-20 NOTE — Progress Notes (Signed)
RETURNED FROM PACU PER BED WITH RN, NO OBVIOUS DISTRESS. STATES PAIN CONTROLLED, NO NAUSEA. CLEAR LIQUID ORDERED. ANT NECK INCISION INTACT, NO BLEEDING, NO AIRWAY COMPLICATION. COLLAR IN PLACE. I.S. GIVEN AND EXPLAINED.
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
## 2016-08-20 NOTE — Transfer of Care (Signed)
Immediate Anesthesia Transfer of Care Note  Patient: Ryan Stark  Procedure(s) Performed: Procedure(s) with comments: ANTERIOR CERVICAL DECOMPRESSION/DISCECTOMY FUSION CERVICAL FOUR - CERVICAL FIVE (N/A) - ANTERIOR CERVICAL DECOMPRESSION/DISCECTOMY FUSION CERVICAL FOUR - CERVICAL FIVE  Patient Location: PACU  Anesthesia Type:General  Level of Consciousness: awake, alert , oriented and patient cooperative  Airway & Oxygen Therapy: Patient Spontanous Breathing and Patient connected to nasal cannula oxygen  Post-op Assessment: Report given to RN and Post -op Vital signs reviewed and stable  Post vital signs: Reviewed and stable  Last Vitals:  Vitals:   08/20/16 0056 08/20/16 0440  BP: (!) 121/56 102/62  Pulse: 76 75  Resp: 16 18  Temp: 37 C 37.2 C    Last Pain:  Vitals:   08/20/16 0440  TempSrc: Oral  PainSc:          Complications: No apparent anesthesia complications

## 2016-08-20 NOTE — Progress Notes (Signed)
Per Dr. Newell CoralNudelman, pt didn't need telemetry; No telemetry orders received; pt telemetry discontinued; CCMD notified. Dionne BucyP. Amo Dorri Ozturk RN

## 2016-08-20 NOTE — Progress Notes (Signed)
Pt ambulated 550 steps in hallway with RN; pt ambulatory in room and BR. Sat up in chair for diner till he went to bed at 2200. Voiding in BR. Dionne BucyP. Amo Cleone Hulick RN

## 2016-08-20 NOTE — Progress Notes (Signed)
1st void recorded

## 2016-08-20 NOTE — Anesthesia Postprocedure Evaluation (Signed)
Anesthesia Post Note  Patient: Johnney OuKomla Mehl  Procedure(s) Performed: Procedure(s) (LRB): ANTERIOR CERVICAL DECOMPRESSION/DISCECTOMY FUSION CERVICAL FOUR - CERVICAL FIVE (N/A)  Patient location during evaluation: PACU Anesthesia Type: General Level of consciousness: awake and alert, patient cooperative and oriented Pain management: pain level controlled Vital Signs Assessment: post-procedure vital signs reviewed and stable Respiratory status: spontaneous breathing, nonlabored ventilation and respiratory function stable Cardiovascular status: blood pressure returned to baseline and stable Postop Assessment: no signs of nausea or vomiting Anesthetic complications: no       Last Vitals:  Vitals:   08/20/16 1130 08/20/16 1145  BP: 101/68 117/64  Pulse: 95 89  Resp: 20 14  Temp: 36.8 C 36.7 C    Last Pain:  Vitals:   08/20/16 1105  TempSrc:   PainSc: Asleep                 Fallan Mccarey,E. Tiki Tucciarone

## 2016-08-20 NOTE — Anesthesia Procedure Notes (Signed)
Procedure Name: Intubation Date/Time: 08/20/2016 7:51 AM Performed by: Tillman AbideHAWKINS, Bertin Inabinet B Pre-anesthesia Checklist: Patient identified, Emergency Drugs available, Suction available and Patient being monitored Patient Re-evaluated:Patient Re-evaluated prior to inductionOxygen Delivery Method: Circle System Utilized Preoxygenation: Pre-oxygenation with 100% oxygen Intubation Type: IV induction Ventilation: Mask ventilation without difficulty Laryngoscope Size: Glidescope and 3 Grade View: Grade I Tube type: Oral Number of attempts: 1 Airway Equipment and Method: Stylet Placement Confirmation: ETT inserted through vocal cords under direct vision,  positive ETCO2 and breath sounds checked- equal and bilateral Secured at: 23 cm Tube secured with: Tape Dental Injury: Teeth and Oropharynx as per pre-operative assessment  Comments: Cervical collar remained in place for intubation and induction.  No manipulation of spine for intubation.

## 2016-08-20 NOTE — Progress Notes (Signed)
Vitals:   08/20/16 1105 08/20/16 1120 08/20/16 1130 08/20/16 1145  BP: 114/71 109/71 101/68 117/64  Pulse: 97 94 95 89  Resp: 14 15 20 14   Temp:   98.2 F (36.8 C) 98.1 F (36.7 C)  TempSrc:      SpO2: 96% 98% 99% 100%  Weight:      Height:        CBC  Recent Labs  08/19/16 0840 08/19/16 1401  WBC 10.8* 8.6  HGB 13.0 13.1  HCT 37.4* 37.9*  PLT 151 139*   BMET  Recent Labs  08/19/16 0210 08/19/16 0211 08/19/16 0840  NA 142 145 144  K 3.8 3.9 4.2  CL 105 104 112*  CO2 21*  --  19*  GLUCOSE 95 101* 93  BUN 16 20 16   CREATININE 1.43* 1.70* 1.24  CALCIUM 9.3  --  9.5    Patient resting in bed, comfortable. Has not yet been mobilized by staff. Spoke with his nurse Gershon Cullriscilla about the importance of getting him up to the chair, and if he does well progressing to ambulation in the hallways at least twice this evening. Orders placed for PT and OT by trauma surgical service.  Wound clean and dry; no swelling, erythema, or drainage. Left deltoid remains 4 minus/5. Moving all 4 extremities otherwise well. Immobilized in an Aspen cervical collar.  Plan: Doing well following surgery. Have spoken with nursing staff about the importance of progressing ambulation. OT will be helpful, particularly regarding left deltoid weakness.  Hewitt ShortsNUDELMAN,ROBERT W, MD 08/20/2016, 5:21 PM

## 2016-08-20 NOTE — Progress Notes (Signed)
Going for surgery now.

## 2016-08-21 ENCOUNTER — Encounter (HOSPITAL_COMMUNITY): Payer: Self-pay | Admitting: Neurosurgery

## 2016-08-21 ENCOUNTER — Inpatient Hospital Stay (HOSPITAL_COMMUNITY): Payer: No Typology Code available for payment source

## 2016-08-21 MED ORDER — ACETAMINOPHEN 325 MG PO TABS
650.0000 mg | ORAL_TABLET | ORAL | Status: DC | PRN
  Administered 2016-08-22: 650 mg via ORAL
  Filled 2016-08-21: qty 2
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
## 2016-08-21 NOTE — Progress Notes (Signed)
Vitals:   08/20/16 2148 08/21/16 0112 08/21/16 0541 08/21/16 0750  BP: 114/63 (!) 91/43 (!) 93/51 (!) 108/56  Pulse: 88 65 80 72  Resp: 20 20 20 20   Temp: 98.7 F (37.1 C) 98.3 F (36.8 C) 98.3 F (36.8 C) 98.4 F (36.9 C)  TempSrc: Oral Oral Oral Oral  SpO2: 100% 100% 100% 100%  Weight:      Height:        CBC  Recent Labs  08/19/16 0840 08/19/16 1401  WBC 10.8* 8.6  HGB 13.0 13.1  HCT 37.4* 37.9*  PLT 151 139*   BMET  Recent Labs  08/19/16 0210 08/19/16 0211 08/19/16 0840  NA 142 145 144  K 3.8 3.9 4.2  CL 105 104 112*  CO2 21*  --  19*  GLUCOSE 95 101* 93  BUN 16 20 16   CREATININE 1.43* 1.70* 1.24  CALCIUM 9.3  --  9.5    Patient sitting up in chair. Ambulated in the halls last night and again this morning. Wound clean and dry. Left deltoid weakness persists. Worked with patient on active and passive range of motion of left shoulder. Have instructed him to do range of motion exercises by himself as well as with his father to maintain mobility in the left shoulder joint.  We'll change IV to saline lock and have requested upright AP and lateral cervical spine x-rays.  Last received pain medication 15 hours ago, (Morphine IM). We'll DC morphine, Norco 5/325 remains available, but patient may be up to get by with just Tylenol when necessary.  Plan: Doing well following surgery yesterday. Encouraged to ambulate in the halls at least 6 times today. To be seen by PT and OT today.   Will check upright cervical spine x-ray today. Will benefit from outpatient OT for left deltoid weakness following discharge. Will need to follow-up with me after discharge in the office in 3 weeks with that lateral cervical spine x-ray at my office.  Will need Rx for Norco 5/325 for following hospitalization, but would encourage the use of Tylenol as the first approach for pain and discomfort.Hewitt Shorts.  NUDELMAN,ROBERT W, MD 08/21/2016, 8:24 AM

## 2016-08-22 MED ORDER — MUPIROCIN 2 % EX OINT
1.0000 "application " | TOPICAL_OINTMENT | Freq: Two times a day (BID) | CUTANEOUS | Status: DC
  Administered 2016-08-22 – 2016-08-23 (×3): 1 via NASAL
  Filled 2016-08-22: qty 22

## 2016-08-22 MED ORDER — CHLORHEXIDINE GLUCONATE CLOTH 2 % EX PADS
6.0000 | MEDICATED_PAD | Freq: Every day | CUTANEOUS | Status: DC
  Administered 2016-08-22 – 2016-08-23 (×2): 6 via TOPICAL
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
## 2016-08-22 NOTE — Progress Notes (Signed)
Vitals:   08/22/16 0100 08/22/16 0500 08/22/16 0927 08/22/16 1331  BP: 102/61 (!) 103/58 103/65 (!) 107/56  Pulse: 75 67 67 71  Resp: 18 20 20 20   Temp: 97.7 F (36.5 C) 98.2 F (36.8 C) 97.7 F (36.5 C) 98.2 F (36.8 C)  TempSrc: Oral Oral Oral Oral  SpO2: 99% 100% 100% 100%  Weight:      Height:        Patient sitting up in chair. Comfortable. Has been ambulating. Left deltoid remains 4 minus/5. Some discomfort in proximal left arm consistent with residual traumatic radiculopathy. Have explained to patient that we want him to primarily use Tylenol or Advil for discomfort although he will be given prescription for some hydrocodone following discharge. Cervical incision healing nicely; no swelling, erythema, or drainage.  Cervical spine x-ray yesterday showed anterior fusion construct intact and in good alignment, overall cervical spine and good alignment, overall appearance excellent.  Plan: Continuing PT and OT. Doing well from a neurosurgical perspective. Agree that discharged tomorrow is reasonable. He'll need to follow-up with me in 3 weeks at my office (as well as with Dr. Lazarus SalinesWolicki (ENT) in about a week and a half (regarding his facial trauma)).  Hewitt ShortsNUDELMAN,ROBERT W, MD 08/22/2016, 3:24 PM

## 2016-08-22 NOTE — Progress Notes (Addendum)
Progress note entered in error

## 2016-08-22 NOTE — Progress Notes (Signed)
Discharge planning on 08/23/16 home with father.  [facility].    Quintella BatonJulie W. Kristene Liberati, RN, BSN  Trauma/Neuro ICU Case Manager [phone]
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
## 2016-08-23 DIAGNOSIS — D62 Acute posthemorrhagic anemia: Secondary | ICD-10-CM | POA: Diagnosis present

## 2016-08-23 DIAGNOSIS — N289 Disorder of kidney and ureter, unspecified: Secondary | ICD-10-CM

## 2016-08-23 DIAGNOSIS — S0081XA Abrasion of other part of head, initial encounter: Secondary | ICD-10-CM | POA: Diagnosis present

## 2016-08-23 DIAGNOSIS — F1010 Alcohol abuse, uncomplicated: Secondary | ICD-10-CM | POA: Diagnosis present

## 2016-08-23 MED ORDER — HYDROCODONE-ACETAMINOPHEN 5-325 MG PO TABS: 1.0000 | ORAL_TABLET | ORAL | 0 refills | Status: DC | PRN

## 2016-08-23 NOTE — Care Management Note (Addendum)
Case Management Note  Patient Details  Name: Ples Trudel MRN: [id number] Date of Birth: 1985-11-18  Subjective/Objective:                    Action/Plan: Pt already had outpatient therapy arranged per previous CM. CM met with the patient about not having insurance and no PCP. Pt was interested in information on the [facility] but did not want appointment set up at this time. CM provided him the information. Pt d/cing with one prescription. CM provided him a coupon for the Norco. Pt states he can afford the medication with with coupon. Pt with order for 3 in1. Jermaine with [facility] notified and will deliver the equipment to the room.  Pt states he has transportation home.   Expected Discharge Date:  08/23/16               Expected Discharge Plan:  Home/Self Care  In-House Referral:     Discharge planning Services  CM Consult  Post Acute Care Choice:    Choice offered to:     DME Arranged:    DME Agency:     HH Arranged:    HH Agency:     Status of Service:  Completed, signed off  If discussed at [employer] of Stay Meetings, dates discussed:    Additional Comments:  Pollie Friar, RN 08/23/2016, 11:34 AM
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
## 2016-08-23 NOTE — Discharge Instructions (Signed)
Wash in shower with soap and water. Apply antibiotic ointment twice daily, and to keep moist as needed. Apply dry dressing as desired.   Keep neck collar on at all times.   No driving while taking Hydrocodone.
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
## 2016-08-23 NOTE — Progress Notes (Signed)
Discharge orders received.  Discharge instructions and follow-up appointments reviewed with the patient.  VSS upon discharge.  IV removed and education complete.  Transported out via wheelchair.

## 2016-09-10 ENCOUNTER — Inpatient Hospital Stay: Payer: Self-pay | Admitting: Family Medicine

## 2016-09-17 ENCOUNTER — Ambulatory Visit: Payer: No Typology Code available for payment source | Admitting: Physical Therapy

## 2016-09-17 ENCOUNTER — Ambulatory Visit: Payer: No Typology Code available for payment source | Attending: General Surgery | Admitting: Occupational Therapy

## 2016-09-17 DIAGNOSIS — M25512 Pain in left shoulder: Secondary | ICD-10-CM | POA: Diagnosis present

## 2016-09-17 DIAGNOSIS — M6281 Muscle weakness (generalized): Secondary | ICD-10-CM | POA: Diagnosis present
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
## 2016-09-26 ENCOUNTER — Ambulatory Visit: Payer: No Typology Code available for payment source | Admitting: Occupational Therapy

## 2016-10-01 ENCOUNTER — Ambulatory Visit: Payer: No Typology Code available for payment source | Admitting: Occupational Therapy

## 2016-10-08 ENCOUNTER — Ambulatory Visit: Payer: No Typology Code available for payment source | Admitting: Occupational Therapy

## 2016-10-08 ENCOUNTER — Telehealth: Payer: Self-pay | Admitting: Occupational Therapy

## 2016-10-08 NOTE — Telephone Encounter (Signed)
Message left on patient's voicemail that he needs to attend today's scheduled appt. At 3:30 or to contact us at [phone], otherwise all remaining appts will be cancelled.

## 2016-10-16 ENCOUNTER — Encounter: Payer: Self-pay | Admitting: Occupational Therapy

## 2016-10-23 ENCOUNTER — Encounter: Payer: Self-pay | Admitting: Occupational Therapy

## 2016-10-30 ENCOUNTER — Encounter: Payer: Self-pay | Admitting: Occupational Therapy

## 2016-11-06 ENCOUNTER — Encounter: Payer: Self-pay | Admitting: Occupational Therapy

## 2016-11-13 ENCOUNTER — Encounter: Payer: Self-pay | Admitting: Occupational Therapy

## 2017-01-13 ENCOUNTER — Emergency Department (HOSPITAL_COMMUNITY)
Admission: EM | Admit: 2017-01-13 | Discharge: 2017-01-13 | Discharge status: Against Medical Advice | Payer: Self-pay | Attending: Emergency Medicine | Admitting: Emergency Medicine

## 2017-01-13 DIAGNOSIS — F22 Delusional disorders: Secondary | ICD-10-CM | POA: Insufficient documentation

## 2017-01-13 LAB — RAPID URINE DRUG SCREEN, HOSP PERFORMED
Amphetamines: NOT DETECTED
BARBITURATES: NOT DETECTED
Benzodiazepines: NOT DETECTED
Cocaine: NOT DETECTED
OPIATES: NOT DETECTED
TETRAHYDROCANNABINOL: NOT DETECTED

## 2017-01-13 LAB — COMPREHENSIVE METABOLIC PANEL
ALT: 11 U/L — AB (ref 17–63)
AST: 19 U/L (ref 15–41)
Albumin: 4.9 g/dL (ref 3.5–5.0)
Alkaline Phosphatase: 75 U/L (ref 38–126)
Anion gap: 13 (ref 5–15)
BILIRUBIN TOTAL: 0.5 mg/dL (ref 0.3–1.2)
BUN: 10 mg/dL (ref 6–20)
CHLORIDE: 110 mmol/L (ref 101–111)
CO2: 23 mmol/L (ref 22–32)
CREATININE: 1.03 mg/dL (ref 0.61–1.24)
Calcium: 10 mg/dL (ref 8.9–10.3)
GFR calc Af Amer: >60 mL/min (ref >60)
Glucose, Bld: 90 mg/dL (ref 65–99)
Potassium: 4 mmol/L (ref 3.5–5.1)
Sodium: 146 mmol/L — ABNORMAL HIGH (ref 135–145)
Total Protein: 8.2 g/dL — ABNORMAL HIGH (ref 6.5–8.1)

## 2017-01-13 LAB — CBC
HCT: 41.1 % (ref 39.0–52.0)
Hemoglobin: 14.2 g/dL (ref 13.0–17.0)
MCH: 28.9 pg (ref 26.0–34.0)
MCHC: 34.5 g/dL (ref 30.0–36.0)
MCV: 83.7 fL (ref 78.0–100.0)
PLATELETS: 159 10*3/uL (ref 150–400)
RBC: 4.91 MIL/uL (ref 4.22–5.81)
RDW: 13.8 % (ref 11.5–15.5)
WBC: 5.5 10*3/uL (ref 4.0–10.5)

## 2017-01-13 LAB — ETHANOL: Alcohol, Ethyl (B): 312 mg/dL (ref <5)

## 2017-01-13 LAB — CBG MONITORING, ED: Glucose-Capillary: 91 mg/dL (ref 65–99)

## 2017-01-13 NOTE — ED Notes (Signed)
Pt called over intercom requesting discharge and 10 minutes later when this writer went to room pt had eloped.

## 2017-01-13 NOTE — ED Notes (Signed)
Ambulated to BR gait steady w/o assist to obtain urine specimen

## 2017-01-13 NOTE — ED Notes (Signed)
Pt. Removed own IV. While cleaning the room the IV saline lock was lying on the floor.RN,Bobby made aware.

## 2017-01-13 NOTE — ED Notes (Signed)
Bed: [id number] Expected date:  Expected time:  Means of arrival:  Comments: ams
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
## 2017-01-13 NOTE — ED Triage Notes (Addendum)
31 yo male [address] [place] [address] store what having rambling conversation "please dont kill me" "please kill me, I need Jesus"  Alert to person only but possible language barrier do to  heavy African accent. Pupils PERRLA 4mm VS BP=110/56 HR= 120 Resp=18 CBG =126

## 2018-01-17 ENCOUNTER — Encounter (HOSPITAL_COMMUNITY): Payer: Self-pay | Admitting: Emergency Medicine

## 2018-01-17 ENCOUNTER — Emergency Department (HOSPITAL_COMMUNITY): Payer: Self-pay

## 2018-01-17 ENCOUNTER — Observation Stay (HOSPITAL_COMMUNITY)
Admission: EM | Admit: 2018-01-17 | Discharge: 2018-01-18 | Disposition: A | Discharge status: Home / Self Care | Payer: Self-pay | Attending: Internal Medicine | Admitting: Internal Medicine

## 2018-01-17 DIAGNOSIS — W109XXA Fall (on) (from) unspecified stairs and steps, initial encounter: Secondary | ICD-10-CM | POA: Insufficient documentation

## 2018-01-17 DIAGNOSIS — Z981 Arthrodesis status: Secondary | ICD-10-CM | POA: Insufficient documentation

## 2018-01-17 DIAGNOSIS — Y908 Blood alcohol level of 240 mg/100 ml or more: Secondary | ICD-10-CM | POA: Insufficient documentation

## 2018-01-17 DIAGNOSIS — W19XXXA Unspecified fall, initial encounter: Secondary | ICD-10-CM

## 2018-01-17 DIAGNOSIS — F10239 Alcohol dependence with withdrawal, unspecified: Secondary | ICD-10-CM | POA: Diagnosis present

## 2018-01-17 DIAGNOSIS — F1010 Alcohol abuse, uncomplicated: Secondary | ICD-10-CM | POA: Diagnosis present

## 2018-01-17 DIAGNOSIS — F10939 Alcohol use, unspecified with withdrawal, unspecified: Secondary | ICD-10-CM | POA: Diagnosis present

## 2018-01-17 DIAGNOSIS — E872 Acidosis, unspecified: Secondary | ICD-10-CM

## 2018-01-17 DIAGNOSIS — F10921 Alcohol use, unspecified with intoxication delirium: Secondary | ICD-10-CM

## 2018-01-17 DIAGNOSIS — R52 Pain, unspecified: Secondary | ICD-10-CM

## 2018-01-17 DIAGNOSIS — F10221 Alcohol dependence with intoxication delirium: Principal | ICD-10-CM | POA: Insufficient documentation

## 2018-01-17 DIAGNOSIS — R000 Tachycardia, unspecified: Secondary | ICD-10-CM | POA: Insufficient documentation

## 2018-01-17 DIAGNOSIS — R7989 Other specified abnormal findings of blood chemistry: Secondary | ICD-10-CM

## 2018-01-17 LAB — CBC WITH DIFFERENTIAL/PLATELET
Abs Immature Granulocytes: 0 10*3/uL (ref 0.0–0.1)
Basophils Absolute: 0 10*3/uL (ref 0.0–0.1)
Basophils Relative: 0 %
EOS ABS: 0.1 10*3/uL (ref 0.0–0.7)
EOS PCT: 1 %
HEMATOCRIT: 43.5 % (ref 39.0–52.0)
Hemoglobin: 14.6 g/dL (ref 13.0–17.0)
IMMATURE GRANULOCYTES: 0 %
Lymphocytes Relative: 57 %
Lymphs Abs: 3 10*3/uL (ref 0.7–4.0)
MCH: 29.4 pg (ref 26.0–34.0)
MCHC: 33.6 g/dL (ref 30.0–36.0)
MCV: 87.7 fL (ref 78.0–100.0)
MONO ABS: 0.7 10*3/uL (ref 0.1–1.0)
MONOS PCT: 13 %
NEUTROS PCT: 29 %
Neutro Abs: 1.6 10*3/uL — ABNORMAL LOW (ref 1.7–7.7)
PLATELETS: 167 10*3/uL (ref 150–400)
RBC: 4.96 MIL/uL (ref 4.22–5.81)
RDW: 12.8 % (ref 11.5–15.5)
WBC: 5.4 10*3/uL (ref 4.0–10.5)

## 2018-01-17 LAB — COMPREHENSIVE METABOLIC PANEL
ALBUMIN: 4.5 g/dL (ref 3.5–5.0)
ALK PHOS: 77 U/L (ref 38–126)
ALT: 14 U/L — AB (ref 17–63)
AST: 25 U/L (ref 15–41)
Anion gap: 12 (ref 5–15)
BUN: 5 mg/dL — AB (ref 6–20)
CO2: 26 mmol/L (ref 22–32)
CREATININE: 1.01 mg/dL (ref 0.61–1.24)
Calcium: 9.3 mg/dL (ref 8.9–10.3)
Chloride: 102 mmol/L (ref 101–111)
GFR calc non Af Amer: >60 mL/min (ref >60)
GLUCOSE: 116 mg/dL — AB (ref 65–99)
Potassium: 3.5 mmol/L (ref 3.5–5.1)
SODIUM: 140 mmol/L (ref 135–145)
Total Bilirubin: 0.5 mg/dL (ref 0.3–1.2)
Total Protein: 7.6 g/dL (ref 6.5–8.1)

## 2018-01-17 LAB — URINALYSIS, COMPLETE (UACMP) WITH MICROSCOPIC
BILIRUBIN URINE: NEGATIVE
Bacteria, UA: NONE SEEN
GLUCOSE, UA: NEGATIVE mg/dL
Hgb urine dipstick: NEGATIVE
Ketones, ur: NEGATIVE mg/dL
LEUKOCYTES UA: NEGATIVE
Nitrite: NEGATIVE
PH: 6 (ref 5.0–8.0)
Protein, ur: NEGATIVE mg/dL
SPECIFIC GRAVITY, URINE: 1.002 — AB (ref 1.005–1.030)

## 2018-01-17 LAB — RAPID URINE DRUG SCREEN, HOSP PERFORMED
AMPHETAMINES: NOT DETECTED
BENZODIAZEPINES: NOT DETECTED
Barbiturates: NOT DETECTED
COCAINE: NOT DETECTED
OPIATES: NOT DETECTED
TETRAHYDROCANNABINOL: NOT DETECTED

## 2018-01-17 LAB — I-STAT CG4 LACTIC ACID, ED: Lactic Acid, Venous: 4.53 mmol/L (ref 0.5–1.9)

## 2018-01-17 LAB — PROTIME-INR
INR: 1.03
Prothrombin Time: 13.4 seconds (ref 11.4–15.2)

## 2018-01-17 LAB — AMMONIA: Ammonia: 43 umol/L — ABNORMAL HIGH (ref 9–35)

## 2018-01-17 LAB — CBG MONITORING, ED: Glucose-Capillary: 112 mg/dL — ABNORMAL HIGH (ref 65–99)

## 2018-01-17 LAB — ETHANOL: Alcohol, Ethyl (B): 526 mg/dL (ref <10)

## 2018-01-17 MED ORDER — SODIUM CHLORIDE 0.9 % IV BOLUS (SEPSIS)
1000.0000 mL | Freq: Once | INTRAVENOUS | Status: AC
  Administered 2018-01-17: 1000 mL via INTRAVENOUS

## 2018-01-17 MED ORDER — SODIUM CHLORIDE 0.9 % IV BOLUS (SEPSIS)
1000.0000 mL | Freq: Once | INTRAVENOUS | Status: AC
Start: 2018-01-17 — End: 2018-01-17
  Administered 2018-01-17: 1000 mL via INTRAVENOUS

## 2018-01-17 NOTE — ED Notes (Signed)
Pt now alert, talking with staff, stating he does not recall events, Dr. Rosalia Hammersay at bedside. L leg restraint removed

## 2018-01-17 NOTE — ED Notes (Signed)
Pt continues to be very resistive to any care, pt does not follow commands.  Condom cath in place.  CT's completed.

## 2018-01-17 NOTE — ED Notes (Signed)
Mittens placed on patient d/t continued attempts to pull monitors and lines

## 2018-01-17 NOTE — ED Notes (Signed)
Pt much calmer speaking with Monique at bedside, pt states he understands conditions for release from restraints and states he understands he will need to stay in hospital.  RLE released from restraints.

## 2018-01-17 NOTE — ED Notes (Signed)
PCXR completed.

## 2018-01-17 NOTE — ED Notes (Signed)
Pt awake, yelling, not following commands well. Pt speaks french, interpretor Ipad brought to room. Pt not listening to staff, Ryan Rung RN ask to come to room to speak with patient as per notes in patients history pt responds and interacts better with african [place] caregivers.
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
## 2018-01-17 NOTE — H&P (Signed)
Ryan Stark WJX:[id number] DOB: [birth year] DOA: 01/17/2018     PCP: Patient, No Pcp Per   Outpatient Specialists:  NONE  Patient arrived to ER on 01/17/18 at 2034  Patient coming from:  home Lives alone,      Chief Complaint:  Chief Complaint  Patient presents with  . Fall    HPI: Ryan Stark is a 32 y.o. male with medical history significant of alcohol abuse    Presented with   fall downstairs while at the hotel.  Patient appears to be intoxicated with alcohol level about 500.  He is tearful and unable to provide detailed history at first now states he usually does not drink much but his brother was in town. He had a few beers still appears to be intoxicated.   Originally from [place] initially stated only french speaking but now answering in [place].     While in ER: It was noted to have elevated lactic acid blood cultures were obtained no fevers was noted no elevated white blood cell count He was rehydrated given persistent tachycardia hospitalist was called for admission Following Medications were ordered in ER: Medications  sodium chloride 0.9 % bolus 1,000 mL (0 mLs Intravenous Stopped 01/17/18 2316)    And  sodium chloride 0.9 % bolus 1,000 mL (0 mLs Intravenous Stopped 01/17/18 2315)    Significant initial  Findings: Abnormal Labs Reviewed  COMPREHENSIVE METABOLIC PANEL - Abnormal; Notable for the following components:      Result Value   Glucose, Bld 116 (*)    BUN 5 (*)    ALT 14 (*)    All other components within normal limits  CBC WITH DIFFERENTIAL/PLATELET - Abnormal; Notable for the following components:   Neutro Abs 1.6 (*)    All other components within normal limits  URINALYSIS, COMPLETE (UACMP) WITH MICROSCOPIC - Abnormal; Notable for the following components:   Color, Urine COLORLESS (*)    Specific Gravity, Urine 1.002 (*)    All other components within normal limits  AMMONIA - Abnormal; Notable for the following components:   Ammonia 43 (*)      All other components within normal limits  ETHANOL - Abnormal; Notable for the following components:   Alcohol, Ethyl (B) 526 (*)    All other components within normal limits  CBG MONITORING, ED - Abnormal; Notable for the following components:   Glucose-Capillary 112 (*)    All other components within normal limits  I-STAT CG4 LACTIC ACID, ED - Abnormal; Notable for the following components:   Lactic Acid, Venous 4.53 (*)    All other components within normal limits     Na 140 K 3.5  Cr   stable,    Lab Results  Component Value Date   CREATININE 1.01 01/17/2018   CREATININE 1.03 01/13/2017   CREATININE 1.24 08/19/2016      WBC 5.4  HG/HCT  Stable     Component Value Date/Time   HGB 14.6 01/17/2018 2050   HCT 43.5 01/17/2018 2050     Troponin (Point of Care Test) No results for input(s): TROPIPOC in the last 72 hours.  Lactic Acid, Venous    Component Value Date/Time   LATICACIDVEN 4.53 (HH) 01/17/2018 2118      UA   no evidence of UTI   CT HEAD  NON acute  CXR -  NON acute    ECG:  Personally reviewed by me showing: HR : 118 Rhythm: sinus tachycardia   no evidence of  ischemic changes QTC 438       ED Triage Vitals  Enc Vitals Group     BP 01/17/18 2045 (!) 145/88     Pulse Rate 01/17/18 2045 (!) 106     Resp 01/17/18 2045 (!) 23     Temp 01/17/18 2107 (!) 96.8 F (36 C)     Temp Source 01/17/18 2107 Temporal     SpO2 01/17/18 2045 96 %     Weight 01/17/18 2109 135 lb (61.2 kg)     Height --      Head Circumference --      Peak Flow --      Pain Score --      Pain Loc --      Pain Edu? --      Excl. in GC? --   TMAX(24)@       Latest  Blood pressure 120/79, pulse 95, temperature (!) 96.8 F (36 C), temperature source Temporal, resp. rate 13, weight 61.2 kg (135 lb), SpO2 99 %.     Hospitalist was called for admission for tachycardia possibly Sirs altered mental status most likely secondary to alcohol intoxication   Review  of Systems:    Pertinent positives include unable to obtain as patient is still under influence.  Past Medical History:   Past Medical History:  Diagnosis Date  . Cervical spine fracture (HCC) 08/19/2016      Past Surgical History:  Procedure Laterality Date  . ANTERIOR CERVICAL DECOMP/DISCECTOMY FUSION N/A 08/20/2016   Procedure: ANTERIOR CERVICAL DECOMPRESSION/DISCECTOMY FUSION CERVICAL FOUR - CERVICAL FIVE;  Surgeon: Shirlean Kelly, MD;  Location: [facility] OR;  Service: Neurosurgery;  Laterality: N/A;  ANTERIOR CERVICAL DECOMPRESSION/DISCECTOMY FUSION CERVICAL FOUR - CERVICAL FIVE    Social History:  Ambulatory   independently      reports that he has never smoked. He has never used smokeless tobacco. He reports that he drinks alcohol. His drug history is not on file.     Family History:   Non contributory asked but unable to provide due intoxication Allergies: No Known Allergies   Prior to Admission medications   Medication Sig Start Date End Date Taking? Authorizing Provider  HYDROcodone-acetaminophen (NORCO/VICODIN) 5-325 MG tablet Take 1-2 tablets by mouth every 4 (four) hours as needed (pain). 08/23/16   Freeman Caldron, PA-C   Physical Exam: Blood pressure 120/79, pulse 95, temperature (!) 96.8 F (36 C), temperature source Temporal, resp. rate 13, weight 61.2 kg (135 lb), SpO2 99 %. 1. General:  in No Acute distress Disheveled -appearing 2. Psychological: Alert and Oriented to self 3. Head/ENT:     Dry Mucous Membranes                          Head Non traumatic, neck supple                           Poor Dentition 4. SKIN:  decreased Skin turgor,  Skin clean Dry and intact no rash 5. Heart: Regular rate and rhythm no  Murmur, no Rub or gallop 6. Lungs:   no wheezes or crackles   7. Abdomen: Soft,  non-tender, Non distended bowel sounds present 8. Lower extremities: no clubbing, cyanosis, or edema 9. Neurologically Grossly intact, moving all 4 extremities  equally  10. MSK: Normal range of motion   LABS:     Recent Labs  Lab 01/17/18 2050  WBC 5.4  NEUTROABS 1.6*  HGB 14.6  HCT 43.5  MCV 87.7  PLT 167   Basic Metabolic Panel: Recent Labs  Lab 01/17/18 2050  NA 140  K 3.5  CL 102  CO2 26  GLUCOSE 116*  BUN 5*  CREATININE 1.01  CALCIUM 9.3      Recent Labs  Lab 01/17/18 2050  AST 25  ALT 14*  ALKPHOS 77  BILITOT 0.5  PROT 7.6  ALBUMIN 4.5   No results for input(s): LIPASE, AMYLASE in the last 168 hours. Recent Labs  Lab 01/17/18 2050  AMMONIA 43*      HbA1C: No results for input(s): HGBA1C in the last 72 hours. CBG: Recent Labs  Lab 01/17/18 2050  GLUCAP 112*      Urine analysis:    Component Value Date/Time   COLORURINE COLORLESS (A) 01/17/2018 2143   APPEARANCEUR CLEAR 01/17/2018 2143   LABSPEC 1.002 (L) 01/17/2018 2143   PHURINE 6.0 01/17/2018 2143   GLUCOSEU NEGATIVE 01/17/2018 2143   HGBUR NEGATIVE 01/17/2018 2143   BILIRUBINUR NEGATIVE 01/17/2018 2143   KETONESUR NEGATIVE 01/17/2018 2143   PROTEINUR NEGATIVE 01/17/2018 2143   NITRITE NEGATIVE 01/17/2018 2143   LEUKOCYTESUR NEGATIVE 01/17/2018 2143       Cultures: No results found for: SDES, SPECREQUEST, CULT, REPTSTATUS   Radiological Exams on Admission: Ct Head Wo Contrast  Result Date: 01/17/2018 CLINICAL DATA:  Status post fall down 3-4 steps with loss of consciousness. Concern for head or cervical spine injury. Initial encounter. EXAM: CT HEAD WITHOUT CONTRAST CT CERVICAL SPINE WITHOUT CONTRAST TECHNIQUE: Multidetector CT imaging of the head and cervical spine was performed following the standard protocol without intravenous contrast. Multiplanar CT image reconstructions of the cervical spine were also generated. COMPARISON:  None. FINDINGS: CT HEAD FINDINGS Brain: No evidence of acute infarction, hemorrhage, hydrocephalus, extra-axial collection or mass lesion/mass effect. The posterior fossa, including the cerebellum, brainstem  and fourth ventricle, is within normal limits. The third and lateral ventricles, and basal ganglia are unremarkable in appearance. The cerebral hemispheres are symmetric in appearance, with normal gray-white differentiation. No mass effect or midline shift is seen. Vascular: No hyperdense vessel or unexpected calcification. Skull: There is no evidence of fracture; visualized osseous structures are unremarkable in appearance. Sinuses/Orbits: The orbits are within normal limits. The paranasal sinuses and mastoid air cells are well-aerated. Other: No significant soft tissue abnormalities are seen. CT CERVICAL SPINE FINDINGS Alignment: Normal. Skull base and vertebrae: No acute fracture. No primary bone lesion or focal pathologic process. Soft tissues and spinal canal: No prevertebral fluid or swelling. No visible canal hematoma. Disc levels: The patient is status post anterior cervical spinal fusion at C4-C5. Intervertebral disc spaces are otherwise preserved. The bony foramina are grossly unremarkable. Upper chest: The visualized lung apices are clear. The thyroid gland is unremarkable. Other: No additional soft tissue abnormalities are seen. IMPRESSION: 1. No evidence of traumatic intracranial injury or fracture. 2. No evidence of fracture or subluxation along the cervical spine. 3. Status post anterior cervical spinal fusion at C4-C5. Electronically Signed   By: Roanna Raider M.D.   On: 01/17/2018 21:46   Ct Cervical Spine Wo Contrast  Result Date: 01/17/2018 CLINICAL DATA:  Status post fall down 3-4 steps with loss of consciousness. Concern for head or cervical spine injury. Initial encounter. EXAM: CT HEAD WITHOUT CONTRAST CT CERVICAL SPINE WITHOUT CONTRAST TECHNIQUE: Multidetector CT imaging of the head and cervical spine was performed following the standard protocol without intravenous contrast. Multiplanar CT image  reconstructions of the cervical spine were also generated. COMPARISON:  None. FINDINGS: CT  HEAD FINDINGS Brain: No evidence of acute infarction, hemorrhage, hydrocephalus, extra-axial collection or mass lesion/mass effect. The posterior fossa, including the cerebellum, brainstem and fourth ventricle, is within normal limits. The third and lateral ventricles, and basal ganglia are unremarkable in appearance. The cerebral hemispheres are symmetric in appearance, with normal gray-white differentiation. No mass effect or midline shift is seen. Vascular: No hyperdense vessel or unexpected calcification. Skull: There is no evidence of fracture; visualized osseous structures are unremarkable in appearance. Sinuses/Orbits: The orbits are within normal limits. The paranasal sinuses and mastoid air cells are well-aerated. Other: No significant soft tissue abnormalities are seen. CT CERVICAL SPINE FINDINGS Alignment: Normal. Skull base and vertebrae: No acute fracture. No primary bone lesion or focal pathologic process. Soft tissues and spinal canal: No prevertebral fluid or swelling. No visible canal hematoma. Disc levels: The patient is status post anterior cervical spinal fusion at C4-C5. Intervertebral disc spaces are otherwise preserved. The bony foramina are grossly unremarkable. Upper chest: The visualized lung apices are clear. The thyroid gland is unremarkable. Other: No additional soft tissue abnormalities are seen. IMPRESSION: 1. No evidence of traumatic intracranial injury or fracture. 2. No evidence of fracture or subluxation along the cervical spine. 3. Status post anterior cervical spinal fusion at C4-C5. Electronically Signed   By: Roanna RaiderJeffery  Chang M.D.   On: 01/17/2018 21:46   Dg Chest Port 1 View  Result Date: 01/17/2018 CLINICAL DATA:  Fall down 3-4 steps with loss of consciousness. EXAM: PORTABLE CHEST 1 VIEW COMPARISON:  None. FINDINGS: Lungs are adequately inflated without consolidation, effusion or pneumothorax. Cardiomediastinal silhouette is normal. No evidence of fracture. IMPRESSION: No  acute findings. Electronically Signed   By: Elberta Fortisaniel  Boyle M.D.   On: 01/17/2018 21:55    Chart has been reviewed    Assessment/Plan  32 y.o. male with medical history significant of alcohol abuse Admitted for altered mental status in the setting of alcohol intoxication but also elevated lactic acid and tachycardia  Present on Admission: . Elevated lactic acid level rehydrate and now coming down  . Tachycardia in the setting of alcohol intoxication but will rehydrate and see if comes down monitor on telemetry  . ETOH abuse order CIWA protocol as patient is at high risk of severe withdrawal Tele Sitter at bedside   Other plan as per orders.  DVT prophylaxis:  SCD   Code Status:  FULL CODE    Family Communication:   Family not at  Bedside    Disposition Plan:     To home once workup is complete and patient is stable                      Social Work  consulted                          Consults called: none  Admission status:   obs   Level of care   SDU       Freddye Cardamone 01/17/2018, 2:22 AM    [facility]  Pager [phone]   after 2 AM please page floor coverage PA If 7AM-7PM, please contact the day team taking care of the patient  [email or web address]  Password [email or web address]

## 2018-01-17 NOTE — ED Triage Notes (Signed)
Per EMS [place], pt fell down 3-4 outside steps with +LOC per Bystanders. Pt very resistive to care, EMS unable to obtain IV, Ccollar in place, no obvious injuries, pt is awake but does not follow commands.

## 2018-01-17 NOTE — ED Notes (Signed)
Several attempts made to call father's number listed in emergency contacts, only busy signal noted.  Will continue to contact family.

## 2018-01-17 NOTE — ED Notes (Signed)
Pt continues to be combative and intermittently attempting to get up, incomprehensible words, minimal response to pain, spitting and making attempts to bite staff. Pt placed in 4 point soft restraints.

## 2018-01-18 ENCOUNTER — Emergency Department (HOSPITAL_COMMUNITY): Payer: Self-pay

## 2018-01-18 ENCOUNTER — Encounter (HOSPITAL_COMMUNITY): Payer: Self-pay

## 2018-01-18 ENCOUNTER — Encounter (HOSPITAL_COMMUNITY): Payer: Self-pay | Admitting: Internal Medicine

## 2018-01-18 ENCOUNTER — Emergency Department (HOSPITAL_COMMUNITY)
Admission: EM | Admit: 2018-01-18 | Discharge: 2018-01-19 | Disposition: A | Discharge status: Home / Self Care | Payer: Self-pay | Attending: Emergency Medicine | Admitting: Emergency Medicine

## 2018-01-18 DIAGNOSIS — F10929 Alcohol use, unspecified with intoxication, unspecified: Secondary | ICD-10-CM | POA: Insufficient documentation

## 2018-01-18 DIAGNOSIS — R7989 Other specified abnormal findings of blood chemistry: Secondary | ICD-10-CM | POA: Diagnosis present

## 2018-01-18 DIAGNOSIS — R000 Tachycardia, unspecified: Secondary | ICD-10-CM | POA: Diagnosis present

## 2018-01-18 DIAGNOSIS — R451 Restlessness and agitation: Secondary | ICD-10-CM | POA: Insufficient documentation

## 2018-01-18 DIAGNOSIS — F10920 Alcohol use, unspecified with intoxication, uncomplicated: Secondary | ICD-10-CM

## 2018-01-18 DIAGNOSIS — F1010 Alcohol abuse, uncomplicated: Secondary | ICD-10-CM

## 2018-01-18 DIAGNOSIS — E872 Acidosis: Secondary | ICD-10-CM

## 2018-01-18 DIAGNOSIS — F10239 Alcohol dependence with withdrawal, unspecified: Secondary | ICD-10-CM | POA: Diagnosis present

## 2018-01-18 DIAGNOSIS — F10921 Alcohol use, unspecified with intoxication delirium: Secondary | ICD-10-CM

## 2018-01-18 DIAGNOSIS — F10939 Alcohol use, unspecified with withdrawal, unspecified: Secondary | ICD-10-CM | POA: Diagnosis present

## 2018-01-18 LAB — CBC
HCT: 39.5 % (ref 39.0–52.0)
Hemoglobin: 13.4 g/dL (ref 13.0–17.0)
MCH: 30.2 pg (ref 26.0–34.0)
MCHC: 33.9 g/dL (ref 30.0–36.0)
MCV: 89 fL (ref 78.0–100.0)
PLATELETS: 128 10*3/uL — AB (ref 150–400)
RBC: 4.44 MIL/uL (ref 4.22–5.81)
RDW: 13 % (ref 11.5–15.5)
WBC: 4.8 10*3/uL (ref 4.0–10.5)

## 2018-01-18 LAB — CBC WITH DIFFERENTIAL/PLATELET
Basophils Absolute: 0 10*3/uL (ref 0.0–0.1)
Basophils Relative: 0 %
EOS PCT: 1 %
Eosinophils Absolute: 0 10*3/uL (ref 0.0–0.7)
HEMATOCRIT: 40.5 % (ref 39.0–52.0)
Hemoglobin: 14 g/dL (ref 13.0–17.0)
LYMPHS ABS: 3.4 10*3/uL (ref 0.7–4.0)
LYMPHS PCT: 59 %
MCH: 30.6 pg (ref 26.0–34.0)
MCHC: 34.6 g/dL (ref 30.0–36.0)
MCV: 88.6 fL (ref 78.0–100.0)
MONO ABS: 0.4 10*3/uL (ref 0.1–1.0)
Monocytes Relative: 7 %
NEUTROS ABS: 1.9 10*3/uL (ref 1.7–7.7)
Neutrophils Relative %: 33 %
PLATELETS: 152 10*3/uL (ref 150–400)
RBC: 4.57 MIL/uL (ref 4.22–5.81)
RDW: 13.3 % (ref 11.5–15.5)
WBC: 5.7 10*3/uL (ref 4.0–10.5)

## 2018-01-18 LAB — I-STAT VENOUS BLOOD GAS, ED
Acid-base deficit: 5 mmol/L — ABNORMAL HIGH (ref 0.0–2.0)
Bicarbonate: 20.7 mmol/L (ref 20.0–28.0)
O2 Saturation: 95 %
PCO2 VEN: 40.8 mmHg — AB (ref 44.0–60.0)
PO2 VEN: 84 mmHg — AB (ref 32.0–45.0)
TCO2: 22 mmol/L (ref 22–32)
pH, Ven: 7.314 (ref 7.250–7.430)

## 2018-01-18 LAB — BASIC METABOLIC PANEL
ANION GAP: 11 (ref 5–15)
CHLORIDE: 108 mmol/L (ref 101–111)
CO2: 27 mmol/L (ref 22–32)
Calcium: 9.3 mg/dL (ref 8.9–10.3)
Creatinine, Ser: 0.8 mg/dL (ref 0.61–1.24)
GFR calc Af Amer: >60 mL/min (ref >60)
GFR calc non Af Amer: >60 mL/min (ref >60)
GLUCOSE: 95 mg/dL (ref 65–99)
POTASSIUM: 3.5 mmol/L (ref 3.5–5.1)
Sodium: 146 mmol/L — ABNORMAL HIGH (ref 135–145)

## 2018-01-18 LAB — COMPREHENSIVE METABOLIC PANEL
ALBUMIN: 3.6 g/dL (ref 3.5–5.0)
ALK PHOS: 54 U/L (ref 38–126)
ALT: 13 U/L — AB (ref 17–63)
AST: 23 U/L (ref 15–41)
Anion gap: 7 (ref 5–15)
BILIRUBIN TOTAL: 0.4 mg/dL (ref 0.3–1.2)
CO2: 26 mmol/L (ref 22–32)
CREATININE: 0.79 mg/dL (ref 0.61–1.24)
Calcium: 8.1 mg/dL — ABNORMAL LOW (ref 8.9–10.3)
Chloride: 113 mmol/L — ABNORMAL HIGH (ref 101–111)
GFR calc Af Amer: >60 mL/min (ref >60)
GLUCOSE: 100 mg/dL — AB (ref 65–99)
Potassium: 4.5 mmol/L (ref 3.5–5.1)
Sodium: 146 mmol/L — ABNORMAL HIGH (ref 135–145)
TOTAL PROTEIN: 6.3 g/dL — AB (ref 6.5–8.1)

## 2018-01-18 LAB — TSH
TSH: 0.406 u[IU]/mL (ref 0.350–4.500)
TSH: 0.577 u[IU]/mL (ref 0.350–4.500)

## 2018-01-18 LAB — TROPONIN I: Troponin I: <0.03 ng/mL (ref <0.03)

## 2018-01-18 LAB — CK: Total CK: 528 U/L — ABNORMAL HIGH (ref 49–397)

## 2018-01-18 LAB — MAGNESIUM: Magnesium: 1.9 mg/dL (ref 1.7–2.4)

## 2018-01-18 LAB — PHOSPHORUS: Phosphorus: 3 mg/dL (ref 2.5–4.6)

## 2018-01-18 LAB — CBG MONITORING, ED: Glucose-Capillary: 90 mg/dL (ref 65–99)

## 2018-01-18 LAB — ETHANOL: Alcohol, Ethyl (B): 415 mg/dL (ref <10)

## 2018-01-18 LAB — LACTIC ACID, PLASMA: LACTIC ACID, VENOUS: 2.1 mmol/L — AB (ref 0.5–1.9)

## 2018-01-18 LAB — HIV ANTIBODY (ROUTINE TESTING W REFLEX): HIV Screen 4th Generation wRfx: NONREACTIVE
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
## 2018-01-18 MED ORDER — STERILE WATER FOR INJECTION IJ SOLN
INTRAMUSCULAR | Status: AC
  Filled 2018-01-18: qty 10

## 2018-01-18 MED ORDER — ACETAMINOPHEN 500 MG PO TABS: 500.0000 mg | ORAL_TABLET | Freq: Four times a day (QID) | ORAL | Status: DC | PRN

## 2018-01-18 MED ORDER — FOLIC ACID 1 MG PO TABS: 1.0000 mg | ORAL_TABLET | Freq: Every day | ORAL | Status: DC

## 2018-01-18 MED ORDER — VITAMIN B-1 100 MG PO TABS: 100.0000 mg | ORAL_TABLET | Freq: Every day | ORAL | Status: DC

## 2018-01-18 MED ORDER — IBUPROFEN 200 MG PO TABS: 200.0000 mg | ORAL_TABLET | Freq: Four times a day (QID) | ORAL | 2 refills | Status: AC | PRN

## 2018-01-18 MED ORDER — ZIPRASIDONE MESYLATE 20 MG IM SOLR
20.0000 mg | Freq: Once | INTRAMUSCULAR | Status: AC
  Administered 2018-01-18: 20 mg via INTRAMUSCULAR

## 2018-01-18 MED ORDER — SODIUM CHLORIDE 0.9 % IV SOLN
INTRAVENOUS | Status: DC
  Administered 2018-01-18: 08:00:00 via INTRAVENOUS

## 2018-01-18 MED ORDER — ONDANSETRON HCL 4 MG PO TABS: 4.0000 mg | ORAL_TABLET | Freq: Four times a day (QID) | ORAL | Status: DC | PRN

## 2018-01-18 MED ORDER — DIPHENHYDRAMINE HCL 50 MG/ML IJ SOLN
INTRAMUSCULAR | Status: AC
  Filled 2018-01-18: qty 1

## 2018-01-18 MED ORDER — SODIUM CHLORIDE 0.9 % IV BOLUS
1000.0000 mL | Freq: Once | INTRAVENOUS | Status: AC
  Administered 2018-01-18: 1000 mL via INTRAVENOUS

## 2018-01-18 MED ORDER — LORAZEPAM 2 MG/ML IJ SOLN
INTRAMUSCULAR | Status: AC
  Filled 2018-01-18: qty 1

## 2018-01-18 MED ORDER — ACETAMINOPHEN 325 MG PO TABS: 650.0000 mg | ORAL_TABLET | Freq: Four times a day (QID) | ORAL | Status: DC | PRN

## 2018-01-18 MED ORDER — DIPHENHYDRAMINE HCL 50 MG/ML IJ SOLN
50.0000 mg | Freq: Once | INTRAMUSCULAR | Status: AC
  Administered 2018-01-18: 50 mg via INTRAMUSCULAR

## 2018-01-18 MED ORDER — LORAZEPAM 2 MG/ML IJ SOLN
2.0000 mg | Freq: Once | INTRAMUSCULAR | Status: AC
  Administered 2018-01-18: 2 mg via INTRAMUSCULAR

## 2018-01-18 MED ORDER — ONDANSETRON HCL 4 MG/2ML IJ SOLN: 4.0000 mg | Freq: Four times a day (QID) | INTRAMUSCULAR | Status: DC | PRN

## 2018-01-18 MED ORDER — ACETAMINOPHEN 650 MG RE SUPP: 650.0000 mg | Freq: Four times a day (QID) | RECTAL | Status: DC | PRN

## 2018-01-18 MED ORDER — ADULT MULTIVITAMIN W/MINERALS CH: 1.0000 | ORAL_TABLET | Freq: Every day | ORAL | Status: DC

## 2018-01-18 MED ORDER — ZIPRASIDONE MESYLATE 20 MG IM SOLR
INTRAMUSCULAR | Status: AC
  Filled 2018-01-18: qty 20

## 2018-01-18 MED ORDER — LORAZEPAM 2 MG/ML IJ SOLN
0.5000 mg | Freq: Once | INTRAMUSCULAR | Status: AC
  Administered 2018-01-18: 0.5 mg via INTRAVENOUS
  Filled 2018-01-18: qty 1

## 2018-01-18 MED ORDER — POTASSIUM CHLORIDE 10 MEQ/100ML IV SOLN
10.0000 meq | INTRAVENOUS | Status: DC
  Administered 2018-01-18 (×3): 10 meq via INTRAVENOUS
  Filled 2018-01-18 (×3): qty 100

## 2018-01-18 MED ORDER — SODIUM CHLORIDE 0.9 % IV SOLN
INTRAVENOUS | Status: DC
  Administered 2018-01-18: 04:00:00 via INTRAVENOUS

## 2018-01-18 MED ORDER — THIAMINE HCL 100 MG/ML IJ SOLN
100.0000 mg | Freq: Every day | INTRAMUSCULAR | Status: DC
  Administered 2018-01-18: 100 mg via INTRAVENOUS
  Filled 2018-01-18: qty 2
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
## 2018-01-18 NOTE — ED Notes (Signed)
Pt released from all restraints, pt verbalized conditions for release,

## 2018-01-18 NOTE — ED Notes (Signed)
Pt attempting to pull on wrap around IV site, mittens placed back on patient.

## 2018-01-18 NOTE — ED Notes (Signed)
REG TRAY ORDERED 

## 2018-01-18 NOTE — ED Notes (Signed)
Pt given instructions by CSW for ETOH abstinence. Voices understanding of discharge instructions. Ambulatory with steady gait. NAD at departure. Friend coming to pick patient up to transport home.

## 2018-01-18 NOTE — ED Notes (Signed)
Patient transported to CT 

## 2018-01-18 NOTE — ED Notes (Signed)
Bed: WA17 Expected date:  Expected time:  Means of arrival:  Comments: 32 yo ETOH

## 2018-01-18 NOTE — ED Notes (Signed)
Pt resting on stretcher under blankets at this time, mittens in place, NAD

## 2018-01-18 NOTE — ED Notes (Signed)
SDU 

## 2018-01-18 NOTE — ED Notes (Signed)
Pt starting to wake up and respond to staff. Nasal trumpet and non-rebreather remain in place

## 2018-01-18 NOTE — ED Notes (Signed)
Pt confirms through french interpretor that he needs medical care, pt verbalized understanding he is very intoxicated and verbalizes understanding of need to stay on stretcher for safety.

## 2018-01-18 NOTE — ED Notes (Signed)
Pt placed on 3L 02 by Menan d/t desat during sleep, pt having apneic periods, pt does take immediate breaths with jaw thrust

## 2018-01-18 NOTE — ED Notes (Signed)
Patient oxygen went down to 49 on 4 L [place]. Patient was bag until  place nasopharyngeal was place by m.d and patient was then place on non rebreather mask. Patient oxygen back up to 100 %. Will continue to monitor the patient.
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
## 2018-01-18 NOTE — ED Notes (Signed)
Pt awake, oriented, does not recall events from last night. Safety sitter at bedside. Pt not requiring supplemental oxygen at this time. HR 85 NSR. Pt cooperative and calm with this RN.

## 2018-01-18 NOTE — Discharge Summary (Signed)
DISCHARGE SUMMARY  Ryan Stark  MR#: [id number]  DOB:[birth year]  Date of Admission: 01/17/2018 Date of Discharge: 01/18/2018  Attending Physician:Reyes Fifield Silvestre Gunner, MD  Patient's JXB:[id number], No Pcp Per  Consults:  none  Disposition: D/C home   Follow-up Appts: Follow-up Information    [facility]. Schedule an appointment as soon as possible for a visit in 2 week(s).   Contact information: [address] [place] [address] [phone]         Discharge Diagnoses: Alcohol intoxication Alcohol abuse - alcoholism Traumatic injury - fall down stairs Mild lactic acidosis   Initial presentation: 32 y.o.malewith a hx of alcohol abuse who presented after he fell down stairs while at a hotel.  His alcohol level was found to be 526. He was reportedly admitted because he was tachycardic and had a modestly elevated lactic acid (after having fallen down a flight of stairs).    Hospital Course: The pt was hydrated and his lactic acidosis resolved.  After he slept in the ED his intoxication resolved.  He had CT scans of the head an cervical spine which ruled out acute fx.  The pt denied any focal pain.  He did not remember the events of the preceding evening which led to his hospitalization.  This MD counseled the pt that he must stop drinking entirely.  The pt voiced understanding and agreed.  He asked for information about "counseling" to help him stop drinking.  CSW was consulted to counsel the pt on community resources prior to his d/c.  He was tolerating a regular diet w/o difficulty.  After sobering up and w/ IVF resuscitation his "SIRS" resolved.  He was stable for, and desired d/c home.    Allergies as of 01/18/2018   No Known Allergies     Medication List    STOP taking these medications   HYDROcodone-acetaminophen 5-325 MG tablet Commonly known as:  NORCO/VICODIN     TAKE these medications   ibuprofen 200 MG  tablet Commonly known as:  MOTRIN IB Take 1-2 tablets (200-400 mg total) by mouth every 6 (six) hours as needed for headache, mild pain or moderate pain.   multivitamin with minerals Tabs tablet Take 1 tablet by mouth daily.       Day of Discharge BP 116/90 (BP Location: Right Arm) Comment: Simultaneous filing. User may not have seen previous data.  Pulse 99 Comment: Simultaneous filing. User may not have seen previous data.  Temp (!) 96.8 F (36 C) (Temporal)   Resp 16 Comment: Simultaneous filing. User may not have seen previous data.  Wt 61.2 kg (135 lb)   SpO2 100% Comment: Simultaneous filing. User may not have seen previous data.  BMI 18.31 kg/m   Physical Exam: General: No acute respiratory distress Lungs: Clear to auscultation bilaterally without wheezes or crackles Cardiovascular: Regular rate and rhythm without murmur gallop or rub normal S1 and S2 Abdomen: Nontender, nondistended, soft, bowel sounds positive, no rebound, no ascites, no appreciable mass Extremities: No significant cyanosis, clubbing, or edema bilateral lower extremities  Basic Metabolic Panel: Recent Labs  Lab 01/17/18 2050 01/18/18 0645  NA 140 146*  K 3.5 4.5  CL 102 113*  CO2 26 26  GLUCOSE 116* 100*  BUN 5* <5*  CREATININE 1.01 0.79  CALCIUM 9.3 8.1*  MG  --  1.9  PHOS  --  3.0    Liver Function Tests: Recent Labs  Lab 01/17/18 2050 01/18/18 0645  AST 25  23  ALT 14* 13*  ALKPHOS 77 54  BILITOT 0.5 0.4  PROT 7.6 6.3*  ALBUMIN 4.5 3.6    Recent Labs  Lab 01/17/18 2050  AMMONIA 43*    Coags: Recent Labs  Lab 01/17/18 2050  INR 1.03   CBC: Recent Labs  Lab 01/17/18 2050 01/18/18 0645  WBC 5.4 4.8  NEUTROABS 1.6*  --   HGB 14.6 13.4  HCT 43.5 39.5  MCV 87.7 89.0  PLT 167 128*    Cardiac Enzymes: Recent Labs  Lab 01/18/18 0024 01/18/18 0645  TROPONINI <0.03 <0.03    CBG: Recent Labs  Lab 01/17/18 2050  GLUCAP 112*    Time spent in discharge  (includes decision making & examination of pt): 30 minutes  01/18/2018, 9:13 AM   Lonia BloodJeffrey T. Kynesha Guerin, MD [facility] Office  [phone] Pager [phone]  On-Call/Text Page:      [email or web address]      password [email or web address]

## 2018-01-18 NOTE — Progress Notes (Signed)
CSW met with patient via bedside to provide resources for substance abuse. Patient presented pleasant and open to discussing resources. Patient stated he previously saw a therapist in [place] who he thought helped him a lot however has not seen him in a while. Patient has not considered seeing a new therapist since living in [place]. Patient was not open to residential treatment at this time however was open to intensive outpatient treatment and stated he would contact some once discharged. Patient was provided list of both residential/ intensive outpatient substance abuse treatment programs and encouraged to follow up at discharge.   Kingsley Spittle, [facility] Emergency Room Clinical Social Worker [phone]

## 2018-01-18 NOTE — Discharge Instructions (Signed)
Alcohol Use Disorder °Alcohol use disorder is when your drinking disrupts your daily life. When you have this condition, you drink too much alcohol and you cannot control your drinking. °Alcohol use disorder can cause serious problems with your physical health. It can affect your brain, heart, liver, pancreas, immune system, stomach, and intestines. Alcohol use disorder can increase your risk for certain cancers and cause problems with your mental health, such as depression, anxiety, psychosis, delirium, and dementia. People with this disorder risk hurting themselves and others. °What are the causes? °This condition is caused by drinking too much alcohol over time. It is not caused by drinking too much alcohol only one or two times. Some people with this condition drink alcohol to cope with or escape from negative life events. Others drink to relieve pain or symptoms of mental illness. °What increases the risk? °You are more likely to develop this condition if: °· You have a family history of alcohol use disorder. °· Your culture encourages drinking to the point of intoxication, or makes alcohol easy to get. °· You had a mood or conduct disorder in childhood. °· You have been a victim of abuse. °· You are an adolescent and: °? You have poor grades or difficulties in school. °? Your caregivers do not talk to you about saying no to alcohol, or supervise your activities. °? You are impulsive or you have trouble with self-control. ° °What are the signs or symptoms? °Symptoms of this condition include: °· Drinking more than you want to. °· Drinking for longer than you want to. °· Trying several times to drink less or to control your drinking. °· Spending a lot of time getting alcohol, drinking, or recovering from drinking. °· Craving alcohol. °· Having problems at work, at school, or at home due to drinking. °· Having problems in relationships due to drinking. °· Drinking when it is dangerous to drink, such as before  driving a car. °· Continuing to drink even though you know you might have a physical or mental problem related to drinking. °· Needing more and more alcohol to get the same effect you want from the alcohol (building up tolerance). °· Having symptoms of withdrawal when you stop drinking. Symptoms of withdrawal include: °? Fatigue. °? Nightmares. °? Trouble sleeping. °? Depression. °? Anxiety. °? Fever. °? Seizures. °? Severe confusion. °? Feeling or seeing things that are not there (hallucinations). °? Tremors. °? Rapid heart rate. °? Rapid breathing. °? High blood pressure. °· Drinking to avoid symptoms of withdrawal. ° °How is this diagnosed? °This condition is diagnosed with an assessment. Your health care provider may start the assessment by asking three or four questions about your drinking. °Your health care provider may perform a physical exam or do lab tests to see if you have physical problems resulting from alcohol use. She or he may refer you to a mental health professional for evaluation. °How is this treated? °Some people with alcohol use disorder are able to reduce their alcohol use to low-risk levels. Others need to completely quit drinking alcohol. When necessary, mental health professionals with specialized training in substance use treatment can help. Your health care provider can help you decide how severe your alcohol use disorder is and what type of treatment you need. The following forms of treatment are available: °· Detoxification. Detoxification involves quitting drinking and using prescription medicines within the first week to help lessen withdrawal symptoms. This treatment is important for people who have had withdrawal symptoms before and for   heavy drinkers who are likely to have withdrawal symptoms. Alcohol withdrawal can be dangerous, and in severe cases, it can cause death. Detoxification may be provided in a home, community, or primary care setting, or in a hospital or substance use  treatment facility. °· Counseling. This treatment is also called talk therapy. It is provided by substance use treatment counselors. A counselor can address the reasons you use alcohol and suggest ways to keep you from drinking again or to prevent problem drinking. The goals of talk therapy are to: °? Find healthy activities and ways for you to cope with stress. °? Identify and avoid the things that trigger your alcohol use. °? Help you learn how to handle cravings. °· Medicines. Medicines can help treat alcohol use disorder by: °? Decreasing alcohol cravings. °? Decreasing the positive feeling you have when you drink alcohol. °? Causing an uncomfortable physical reaction when you drink alcohol (aversion therapy). °· Support groups. Support groups are led by people who have quit drinking. They provide emotional support, advice, and guidance. ° °These forms of treatment are often combined. Some people with this condition benefit from a combination of treatments provided by specialized substance use treatment centers. °Follow these instructions at home: °· Take over-the-counter and prescription medicines only as told by your health care provider. °· Check with your health care provider before starting any new medicines. °· Ask friends and family members not to offer you alcohol. °· Avoid situations where alcohol is served, including gatherings where others are drinking alcohol. °· Create a plan for what to do when you are tempted to use alcohol. °· Find hobbies or activities that you enjoy that do not include alcohol. °· Keep all follow-up visits as told by your health care provider. This is important. °How is this prevented? °· If you drink, limit alcohol intake to no more than 1 drink a day for nonpregnant women and 2 drinks a day for men. One drink equals 12 oz of beer, 5 oz of wine, or 1½ oz of hard liquor. °· If you have a mental health condition, get treatment and support. °· Do not give alcohol to  adolescents. °· If you are an adolescent: °? Do not drink alcohol. °? Do not be afraid to say no if someone offers you alcohol. Speak up about why you do not want to drink. You can be a positive role model for your friends and set a good example for those around you by not drinking alcohol. °? If your friends drink, spend time with others who do not drink alcohol. Make new friends who do not use alcohol. °? Find healthy ways to manage stress and emotions, such as meditation or deep breathing, exercise, spending time in nature, listening to music, or talking with a trusted friend or family member. °Contact a health care provider if: °· You are not able to take your medicines as told. °· Your symptoms get worse. °· You return to drinking alcohol (relapse) and your symptoms get worse. °Get help right away if: °· You have thoughts about hurting yourself or others. °If you ever feel like you may hurt yourself or others, or have thoughts about taking your own life, get help right away. You can go to your nearest emergency department or call: °· Your local emergency services (911 in the [place] °· A suicide crisis helpline, such as the National Suicide Prevention Lifeline at 1-800-273-8255. This is open 24 hours a day. ° °Summary °· Alcohol use disorder is when your   drinking disrupts your daily life. When you have this condition, you drink too much alcohol and you cannot control your drinking.  Treatment may include detoxification, counseling, medicine, and support groups.  Ask friends and family members not to offer you alcohol. Avoid situations where alcohol is served.  Get help right away if you have thoughts about hurting yourself or others. This information is not intended to replace advice given to you by your health care provider. Make sure you discuss any questions you have with your health care provider. Document Released: 09/05/2004 Document Revised: 04/25/2016 Document Reviewed: 04/25/2016 Elsevier  Interactive Patient Education  2018 [employer].   Alcohol Intoxication Alcohol intoxication happens when a person cannot think clearly or function well (becomes impaired) after drinking alcohol. This condition can happen even after one drink. It can be dangerous, especially if:  The person drank a lot of alcohol in a short amount of time (binge drinking).  The person also took certain medicines or drugs.  If the intoxication is very bad, a breathing machine (ventilator) may be needed until the alcohol wears off. Follow these instructions at home: General instructions  Do not drive after drinking alcohol.  Have someone stay with you. You should not be left alone while you have this condition.  Make sure you have enough fluid in your body. To do this: ? Drink enough fluid to keep your pee (urine) clear or pale yellow. ? Avoid caffeine. It can make you thirsty.  Take over-the-counter and prescription medicines only as told by your doctor. To prevent this condition:  Limit alcohol intake to no more than 1 drink a day for nonpregnant women and 2 drinks a day for men. One drink equals 12 oz of beer, 5 oz of wine, or 1 oz of hard liquor.  Do not drink alcohol on an empty stomach.  Avoid drinking alcohol if: ? You are under the legal drinking age. ? You are pregnant or may be pregnant. ? You are taking medicines that you should not take with alcohol. ? Your drinking causes your medical condition to get worse. ? You need to drive or do activities that require your attention. ? You have substance use disorder. If this condition happens again:  Get help right away if you or someone you know: ? Has medium or very bad trouble with:  Movement (coordination).  Speech.  Memory.  Attention. ? Passes out (faints). ? Is confused. ? Is throwing up (vomiting).  Do not leave someone with this condition alone. Contact a doctor if:  You do not feel better after a few days.  You  are having problems at work, at school, or at home because of drinking. Get help right away if:  You get shaky when you try to stop drinking.  You start shaking and you cannot control it (have a seizure).  You throw up blood. The blood may be bright red or look like coffee grounds.  You see blood in your poop (stool). The blood may: ? Be bright red. ? Make your poop black and tarry and make it smell bad.  You start to feel light-headed.  You pass out. If you ever feel like you may hurt yourself or others, or have thoughts about taking your own life, get help right away. You can go to your nearest emergency department or call:  Your local emergency services (911 in the [place]  A suicide crisis helpline, such as the National Suicide Prevention Lifeline at 818-773-9656. This is  open 24 hours a day.  This information is not intended to replace advice given to you by your health care provider. Make sure you discuss any questions you have with your health care provider. Document Released: 01/15/2008 Document Revised: 04/18/2016 Document Reviewed: 04/18/2016 Elsevier Interactive Patient Education  2017 [employer]

## 2018-01-19 ENCOUNTER — Emergency Department (HOSPITAL_COMMUNITY): Payer: Self-pay

## 2018-01-19 IMAGING — DX DG CHEST 1V PORT
1 series · 1 of 1 positions shown · non-contrast
Comparison: Chest CT [DATE]

CLINICAL DATA: Hypoxia and alcohol use.

EXAM:
PORTABLE CHEST 1 VIEW

[chest ap]
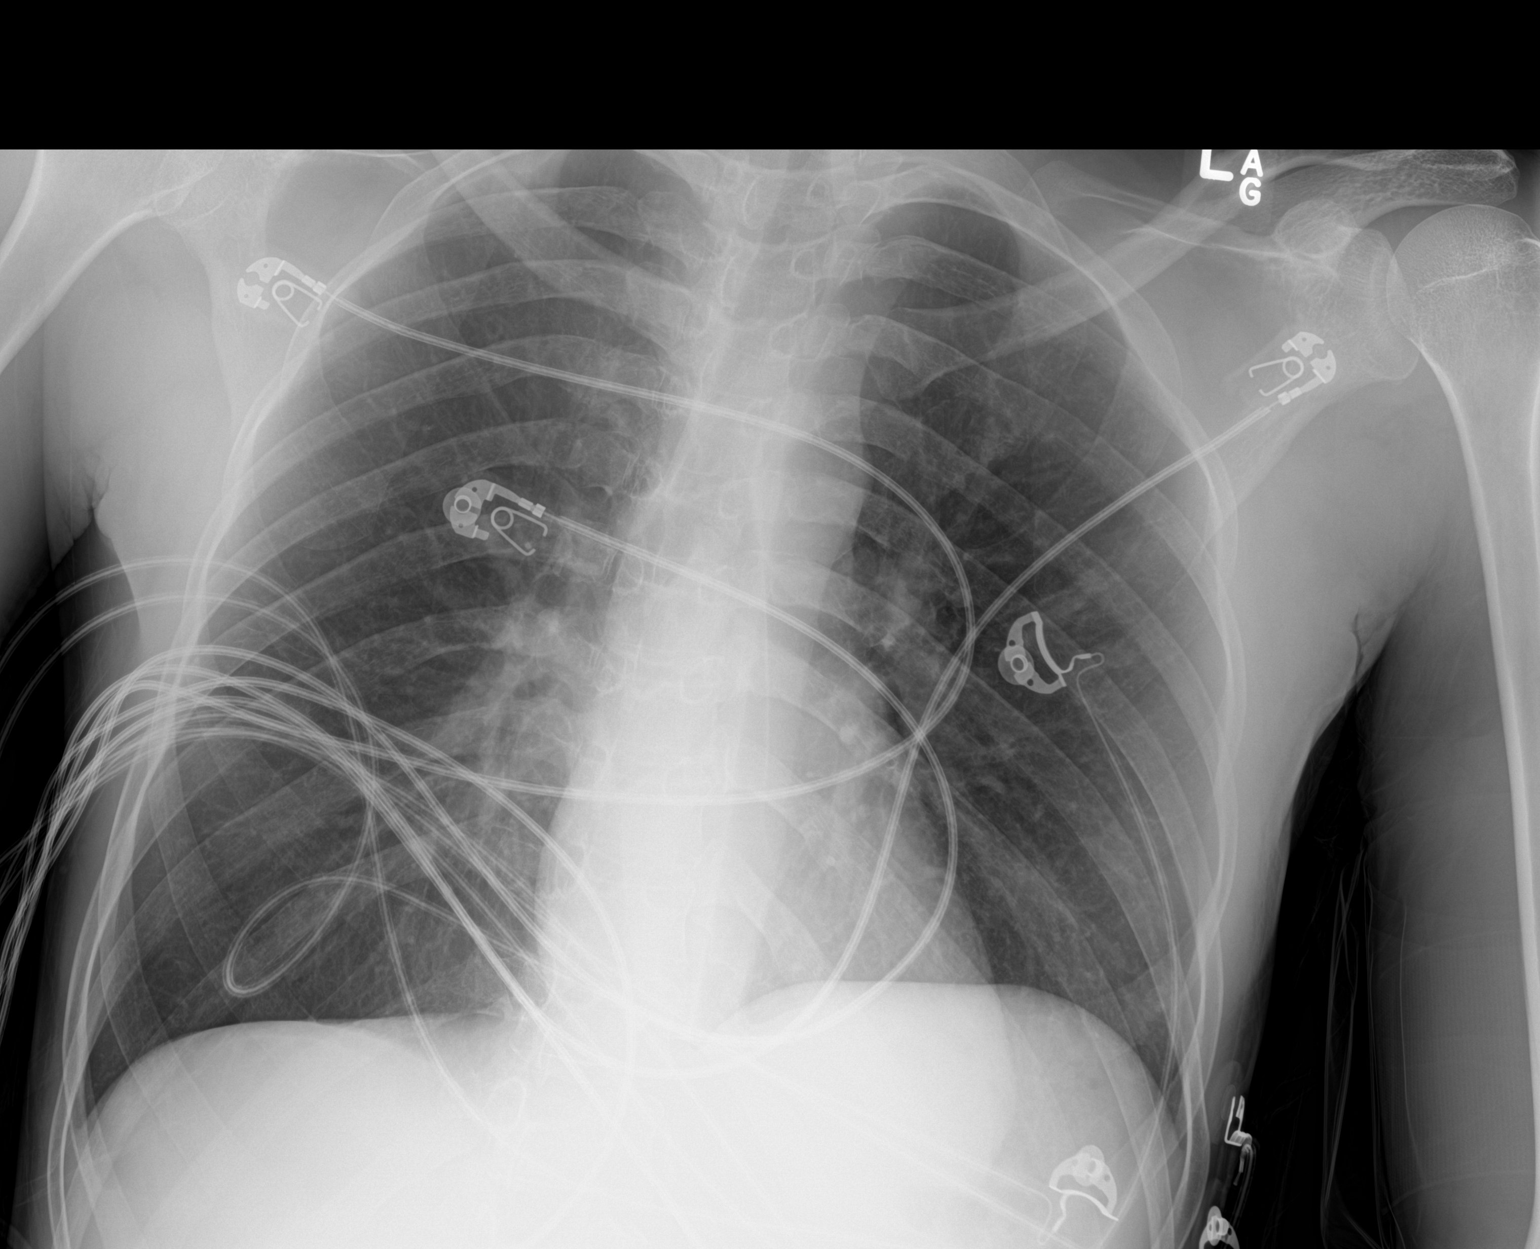

[1 of 1 positions shown; findings below may reference images not displayed]

FINDINGS: The patient is tilted to the left. Heart and mediastinal contours
within normal limits. The lungs are clear. No pneumothorax, effusion
or pulmonary consolidation. No acute osseous abnormality.
IMPRESSION: No active disease.

## 2018-01-19 MED ORDER — ADULT MULTIVITAMIN W/MINERALS CH: 1.0000 | ORAL_TABLET | Freq: Every day | ORAL | 0 refills | Status: DC

## 2018-01-19 MED ORDER — SODIUM CHLORIDE 0.9 % IV BOLUS
1000.0000 mL | Freq: Once | INTRAVENOUS | Status: AC
  Administered 2018-01-19: 1000 mL via INTRAVENOUS

## 2018-01-19 NOTE — ED Provider Notes (Signed)
32 yo M with h/o EtOH abuse here with recurrent EtOH intoxication, aggressive behavior. Pt required IM sedation on arrival with subsequent mild hypoxia, but is not hypoxic when awake. I added on CXR which is unremarkable. He seems to be appropriately sobering in ED and CT scans are negative. Plan to monitor for sobriety and reassess.  5:55 AM Pt now awake, alert, in NAD. Ambulatory w/o difficulty. Not hypoxic on RA once sedation has worn off. D/c with outpt follow-up, return precautions, and substance abuse counselling.   Shaune PollackIsaacs, Shalon Councilman, MD 01/19/18 [phone]

## 2018-01-19 NOTE — ED Notes (Signed)
Patient ambulated around room independently

## 2018-01-23 LAB — CULTURE, BLOOD (ROUTINE X 2)
CULTURE: NO GROWTH
Culture: NO GROWTH
SPECIAL REQUESTS: ADEQUATE
SPECIAL REQUESTS: ADEQUATE

## 2018-04-18 ENCOUNTER — Encounter (HOSPITAL_COMMUNITY): Payer: Self-pay | Admitting: Emergency Medicine

## 2018-04-18 ENCOUNTER — Other Ambulatory Visit: Payer: Self-pay

## 2018-04-18 ENCOUNTER — Emergency Department (HOSPITAL_COMMUNITY)
Admission: EM | Admit: 2018-04-18 | Discharge: 2018-04-19 | Disposition: A | Discharge status: Home / Self Care | Payer: Self-pay | Attending: Emergency Medicine | Admitting: Emergency Medicine

## 2018-04-18 DIAGNOSIS — F10929 Alcohol use, unspecified with intoxication, unspecified: Secondary | ICD-10-CM

## 2018-04-18 DIAGNOSIS — F10920 Alcohol use, unspecified with intoxication, uncomplicated: Secondary | ICD-10-CM | POA: Insufficient documentation

## 2018-04-18 DIAGNOSIS — R451 Restlessness and agitation: Secondary | ICD-10-CM | POA: Insufficient documentation

## 2018-04-18 DIAGNOSIS — Z79899 Other long term (current) drug therapy: Secondary | ICD-10-CM | POA: Insufficient documentation
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
## 2018-04-18 NOTE — ED Notes (Signed)
Bed: WA21 Expected date:  Expected time:  Means of arrival:  Comments: Unresponsive EMS

## 2018-04-19 LAB — COMPREHENSIVE METABOLIC PANEL
ALT: 15 U/L (ref 0–44)
ANION GAP: 16 — AB (ref 5–15)
AST: 27 U/L (ref 15–41)
Albumin: 4.5 g/dL (ref 3.5–5.0)
Alkaline Phosphatase: 74 U/L (ref 38–126)
BILIRUBIN TOTAL: 0.5 mg/dL (ref 0.3–1.2)
BUN: 7 mg/dL (ref 6–20)
CALCIUM: 9.3 mg/dL (ref 8.9–10.3)
CO2: 24 mmol/L (ref 22–32)
Chloride: 108 mmol/L (ref 98–111)
Creatinine, Ser: 0.93 mg/dL (ref 0.61–1.24)
GLUCOSE: 110 mg/dL — AB (ref 70–99)
Potassium: 3.9 mmol/L (ref 3.5–5.1)
SODIUM: 148 mmol/L — AB (ref 135–145)
TOTAL PROTEIN: 7.6 g/dL (ref 6.5–8.1)

## 2018-04-19 LAB — CBC
HCT: 42.1 % (ref 39.0–52.0)
Hemoglobin: 14.6 g/dL (ref 13.0–17.0)
MCH: 30.3 pg (ref 26.0–34.0)
MCHC: 34.7 g/dL (ref 30.0–36.0)
MCV: 87.3 fL (ref 78.0–100.0)
PLATELETS: 163 10*3/uL (ref 150–400)
RBC: 4.82 MIL/uL (ref 4.22–5.81)
RDW: 12.7 % (ref 11.5–15.5)
WBC: 6.2 10*3/uL (ref 4.0–10.5)

## 2018-04-19 LAB — ETHANOL: ALCOHOL ETHYL (B): 415 mg/dL — AB (ref <10)

## 2018-04-19 NOTE — ED Notes (Addendum)
[employer] remains at bedside, pt sleeping but still cuffed to side rails to avoid incident
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
## 2018-05-31 ENCOUNTER — Emergency Department (HOSPITAL_COMMUNITY)
Admission: EM | Admit: 2018-05-31 | Discharge: 2018-06-01 | Disposition: A | Discharge status: Home / Self Care | Payer: Self-pay | Attending: Emergency Medicine | Admitting: Emergency Medicine

## 2018-05-31 ENCOUNTER — Other Ambulatory Visit: Payer: Self-pay

## 2018-05-31 DIAGNOSIS — F10920 Alcohol use, unspecified with intoxication, uncomplicated: Secondary | ICD-10-CM | POA: Insufficient documentation

## 2018-05-31 LAB — COMPREHENSIVE METABOLIC PANEL
ALK PHOS: 65 U/L (ref 38–126)
ALT: 14 U/L (ref 0–44)
AST: 20 U/L (ref 15–41)
Albumin: 4.7 g/dL (ref 3.5–5.0)
Anion gap: 12 (ref 5–15)
BUN: 13 mg/dL (ref 6–20)
CALCIUM: 9.8 mg/dL (ref 8.9–10.3)
CO2: 23 mmol/L (ref 22–32)
CREATININE: 0.92 mg/dL (ref 0.61–1.24)
Chloride: 110 mmol/L (ref 98–111)
GFR calc non Af Amer: >60 mL/min (ref >60)
Glucose, Bld: 106 mg/dL — ABNORMAL HIGH (ref 70–99)
Potassium: 3.7 mmol/L (ref 3.5–5.1)
SODIUM: 145 mmol/L (ref 135–145)
Total Bilirubin: 0.6 mg/dL (ref 0.3–1.2)
Total Protein: 8.2 g/dL — ABNORMAL HIGH (ref 6.5–8.1)

## 2018-05-31 LAB — CBC
HCT: 46 % (ref 39.0–52.0)
HEMOGLOBIN: 14.8 g/dL (ref 13.0–17.0)
MCH: 29.1 pg (ref 26.0–34.0)
MCHC: 32.2 g/dL (ref 30.0–36.0)
MCV: 90.6 fL (ref 80.0–100.0)
NRBC: 0 % (ref 0.0–0.2)
PLATELETS: 189 10*3/uL (ref 150–400)
RBC: 5.08 MIL/uL (ref 4.22–5.81)
RDW: 13.2 % (ref 11.5–15.5)
WBC: 5.2 10*3/uL (ref 4.0–10.5)

## 2018-05-31 LAB — ETHANOL: Alcohol, Ethyl (B): 363 mg/dL (ref <10)
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
## 2018-05-31 NOTE — ED Notes (Signed)
Bed: [facility] Expected date:  Expected time:  Means of arrival:  Comments: 32 yo M/ETOH
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
## 2018-06-01 NOTE — Discharge Instructions (Signed)
Recommend stop drinking alcohol. You can follow-up with one of the outpatient centers if you would like help getting sober.

## 2018-06-01 NOTE — ED Notes (Signed)
Pt awake, slightly agitated. Pt voided bladder but refused to give urine sample.

## 2018-06-01 NOTE — ED Notes (Addendum)
CRITICAL VALUE ALERT  Critical Value:  Alcohol 363  Date & Time Notied:  05/31/18  Provider Notified: Sharilyn Sites   Orders Received/Actions taken:

## 2019-06-08 ENCOUNTER — Emergency Department (HOSPITAL_COMMUNITY)
Admission: EM | Admit: 2019-06-08 | Discharge: 2019-06-08 | Disposition: A | Discharge status: Home / Self Care | Payer: Self-pay | Attending: Emergency Medicine | Admitting: Emergency Medicine

## 2019-06-08 ENCOUNTER — Encounter (HOSPITAL_COMMUNITY): Payer: Self-pay | Admitting: Emergency Medicine

## 2019-06-08 ENCOUNTER — Other Ambulatory Visit: Payer: Self-pay

## 2019-06-08 DIAGNOSIS — H7291 Unspecified perforation of tympanic membrane, right ear: Secondary | ICD-10-CM | POA: Insufficient documentation

## 2019-06-08 DIAGNOSIS — H9201 Otalgia, right ear: Secondary | ICD-10-CM | POA: Insufficient documentation

## 2019-06-08 NOTE — Discharge Instructions (Signed)
During the evaluation we noted that you had perforated your right tympanic membrane.  Unfortunately, there is no treatment for this aside from keeping any water, liquids away from your ear.  This will likely self resolve in the next few weeks.  If you experience any headache, decrease in hearing, worsening symptoms or dizziness you may return to the emergency room.

## 2019-06-08 NOTE — ED Triage Notes (Signed)
Pt reports having right ear bleeding last night.
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
## 2019-07-09 ENCOUNTER — Emergency Department (HOSPITAL_COMMUNITY)
Admission: EM | Admit: 2019-07-09 | Discharge: 2019-07-09 | Disposition: A | Discharge status: Home / Self Care | Payer: Self-pay | Attending: Emergency Medicine | Admitting: Emergency Medicine

## 2019-07-09 ENCOUNTER — Encounter (HOSPITAL_COMMUNITY): Payer: Self-pay

## 2019-07-09 ENCOUNTER — Other Ambulatory Visit: Payer: Self-pay

## 2019-07-09 DIAGNOSIS — F10920 Alcohol use, unspecified with intoxication, uncomplicated: Secondary | ICD-10-CM

## 2019-07-09 DIAGNOSIS — F10120 Alcohol abuse with intoxication, uncomplicated: Secondary | ICD-10-CM | POA: Insufficient documentation

## 2019-07-09 NOTE — ED Notes (Signed)
Pt refuses to keep mask on, despite being asked multiple times to keep it on. Pt took belongings and threw them in the floor.
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
## 2019-07-09 NOTE — ED Notes (Signed)
[facility]iffany contacted per pt request and stated she was coming to pick him up

## 2019-07-09 NOTE — ED Notes (Signed)
Pt repeatedly taking off mask and throwing it in the floor. Refuses to comply.

## 2019-07-09 NOTE — ED Notes (Signed)
Pt walked with steady gait unassisted to restroom without incident. Pt is adamantly requesting to leave with [facility] who is here now.

## 2019-07-09 NOTE — ED Notes (Signed)
Pt told staff "I am sober now. Call Ryan Stark"

## 2020-05-01 ENCOUNTER — Encounter (HOSPITAL_COMMUNITY): Payer: Self-pay | Admitting: Emergency Medicine

## 2020-05-01 ENCOUNTER — Emergency Department (HOSPITAL_COMMUNITY)
Admission: EM | Admit: 2020-05-01 | Discharge: 2020-05-02 | Disposition: A | Discharge status: Home / Self Care | Payer: Self-pay | Attending: Emergency Medicine | Admitting: Emergency Medicine

## 2020-05-01 ENCOUNTER — Other Ambulatory Visit: Payer: Self-pay

## 2020-05-01 DIAGNOSIS — Z5321 Procedure and treatment not carried out due to patient leaving prior to being seen by health care provider: Secondary | ICD-10-CM | POA: Insufficient documentation

## 2020-05-01 DIAGNOSIS — R569 Unspecified convulsions: Secondary | ICD-10-CM | POA: Insufficient documentation

## 2020-05-01 LAB — CBC
HCT: 46.5 % (ref 39.0–52.0)
Hemoglobin: 15.6 g/dL (ref 13.0–17.0)
MCH: 31.8 pg (ref 26.0–34.0)
MCHC: 33.5 g/dL (ref 30.0–36.0)
MCV: 94.7 fL (ref 80.0–100.0)
Platelets: 98 10*3/uL — ABNORMAL LOW (ref 150–400)
RBC: 4.91 MIL/uL (ref 4.22–5.81)
RDW: 12.5 % (ref 11.5–15.5)
WBC: 4.4 10*3/uL (ref 4.0–10.5)
nRBC: 0 % (ref 0.0–0.2)

## 2020-05-01 LAB — BASIC METABOLIC PANEL
Anion gap: 13 (ref 5–15)
BUN: 13 mg/dL (ref 6–20)
CO2: 22 mmol/L (ref 22–32)
Calcium: 10.1 mg/dL (ref 8.9–10.3)
Chloride: 102 mmol/L (ref 98–111)
Creatinine, Ser: 1.21 mg/dL (ref 0.61–1.24)
GFR calc Af Amer: >60 mL/min (ref >60)
GFR calc non Af Amer: >60 mL/min (ref >60)
Glucose, Bld: 160 mg/dL — ABNORMAL HIGH (ref 70–99)
Potassium: 4.1 mmol/L (ref 3.5–5.1)
Sodium: 137 mmol/L (ref 135–145)

## 2020-05-01 NOTE — ED Triage Notes (Signed)
Pt brought to ED by [facility] from home for possible seizure activity no prior hx of seizure, per EMS pt was very confuse initially on arrival to his house, pt states he was shaking all over prior to ems getting to his house, no seizure Hx. Pt is AO x 4 on ED arrival BP 112/64, HR 104, R 18, SPO2 99% RA, temp 97.1 cbg 110.

## 2020-05-01 NOTE — ED Notes (Signed)
Patient stated he wanted to leave. IV removed and patient has left.

## 2020-07-18 ENCOUNTER — Other Ambulatory Visit: Payer: Self-pay

## 2020-07-18 ENCOUNTER — Ambulatory Visit: Payer: Self-pay | Attending: Nurse Practitioner | Admitting: Nurse Practitioner

## 2020-09-28 ENCOUNTER — Emergency Department (HOSPITAL_COMMUNITY)
Admission: EM | Admit: 2020-09-28 | Discharge: 2020-09-28 | Disposition: A | Discharge status: Home / Self Care | Payer: Self-pay | Attending: Emergency Medicine | Admitting: Emergency Medicine

## 2020-09-28 ENCOUNTER — Encounter (HOSPITAL_COMMUNITY): Payer: Self-pay | Admitting: Emergency Medicine

## 2020-09-28 ENCOUNTER — Other Ambulatory Visit: Payer: Self-pay

## 2020-09-28 DIAGNOSIS — R443 Hallucinations, unspecified: Secondary | ICD-10-CM

## 2020-09-28 DIAGNOSIS — M542 Cervicalgia: Secondary | ICD-10-CM | POA: Insufficient documentation

## 2020-09-28 DIAGNOSIS — R440 Auditory hallucinations: Secondary | ICD-10-CM | POA: Insufficient documentation

## 2020-09-28 DIAGNOSIS — Z9889 Other specified postprocedural states: Secondary | ICD-10-CM | POA: Insufficient documentation
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
## 2020-09-28 NOTE — ED Triage Notes (Signed)
Patient here for evaluation of a cervical fusion he had in 2018. Patient states his neck has started hurting more in the last twelve months when he lifts heavy objects. Patient wants to know what exactly was placed in his neck and if he can have it removed.

## 2020-10-09 ENCOUNTER — Ambulatory Visit (HOSPITAL_COMMUNITY): Payer: No Payment, Other | Admitting: Licensed Clinical Social Worker

## 2020-10-09 ENCOUNTER — Other Ambulatory Visit: Payer: Self-pay

## 2020-10-09 DIAGNOSIS — F330 Major depressive disorder, recurrent, mild: Secondary | ICD-10-CM
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
## 2020-10-30 ENCOUNTER — Other Ambulatory Visit (HOSPITAL_COMMUNITY): Payer: Self-pay | Admitting: Neurological Surgery

## 2020-10-30 ENCOUNTER — Other Ambulatory Visit: Payer: Self-pay | Admitting: Neurological Surgery

## 2020-10-30 DIAGNOSIS — M5412 Radiculopathy, cervical region: Secondary | ICD-10-CM

## 2020-11-04 ENCOUNTER — Other Ambulatory Visit: Payer: Self-pay

## 2020-11-04 ENCOUNTER — Ambulatory Visit (HOSPITAL_COMMUNITY)
Admission: RE | Admit: 2020-11-04 | Discharge: 2020-11-04 | Disposition: A | Discharge status: Home / Self Care | Payer: Self-pay | Source: Ambulatory Visit | Attending: Neurological Surgery | Admitting: Neurological Surgery

## 2020-11-04 DIAGNOSIS — M5412 Radiculopathy, cervical region: Secondary | ICD-10-CM

## 2020-11-04 IMAGING — MR MR CERVICAL SPINE W/O CM
5 of 6 series · 36 of 48 positions shown · non-contrast
Comparison: Cervical spine CT [DATE]

CLINICAL DATA: Cervical radiculopathy. Neck pain radiating into the
left shoulder and hand for 1 year

EXAM:
MRI CERVICAL SPINE WITHOUT CONTRAST
TECHNIQUE: Multiplanar, multisequence MR imaging of the cervical spine was
performed. No intravenous contrast was administered.

[Series 5: T1 · sagittal · 3.0mm · 0.69mm/px · 5 of 15 slices shown (1 of 2)]
[im 1/15]
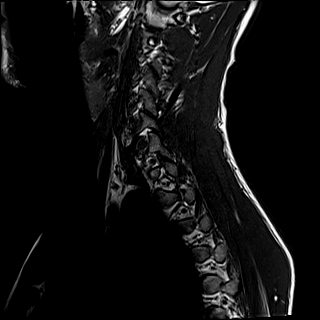
[im 4/15]
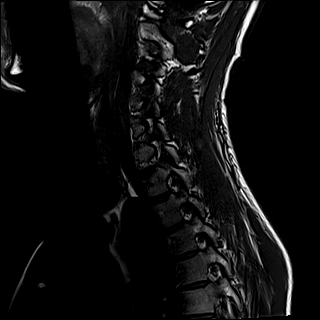
[im 8/15]
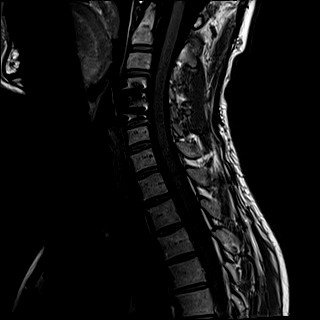
[im 11/15]
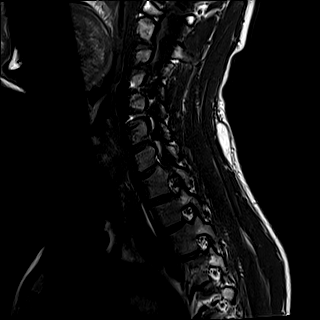
[im 15/15]
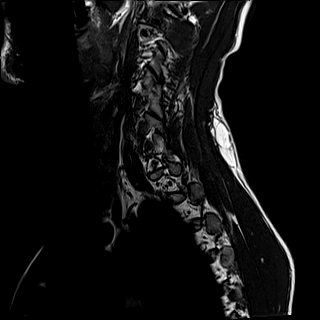

[Series 6: T2 · sagittal · 3.0mm · 0.69mm/px · 5 of 15 slices shown (1 of 2)]
[im 1/15]
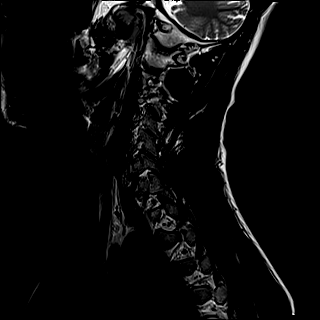
[im 4/15]
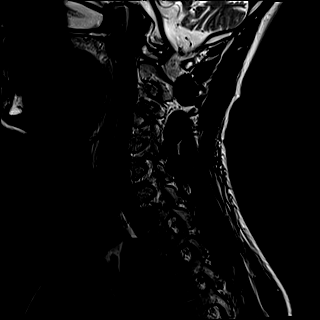
[im 8/15]
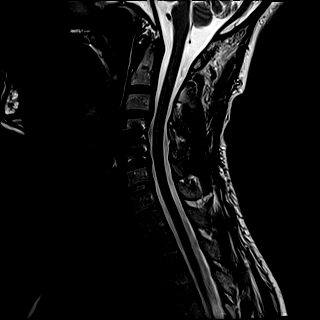
[im 11/15]
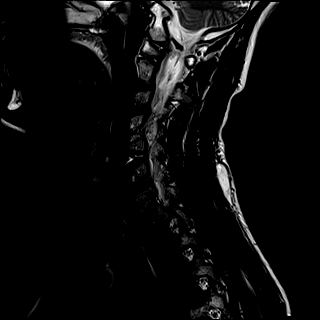
[im 15/15]
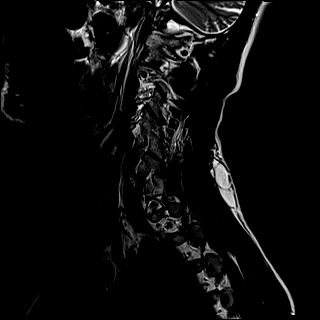

[Series 7: STIR · sagittal · 3.0mm · 0.86mm/px · 4 of 15 slices shown]
[im 1/15]
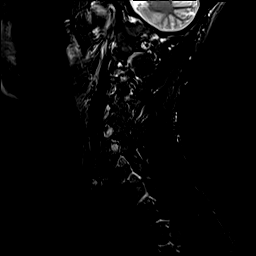
[im 4/15]
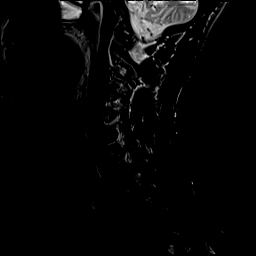
[im 8/15]
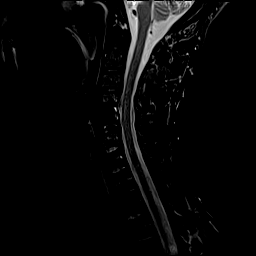
[im 11/15]
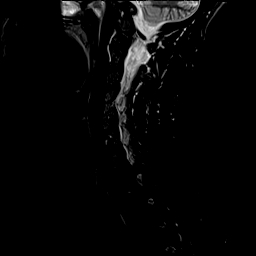

[Series 8: T2 · axial · 3.0mm · 0.78mm/px · z∈[-37,+78]mm · 11 of 34 slices shown (2 of 2)]
[im 1/34]
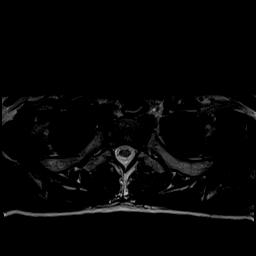
[im 4/34]
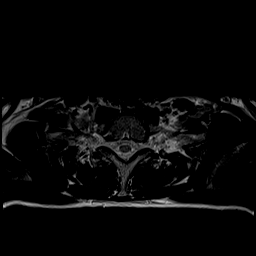
[im 7/34]
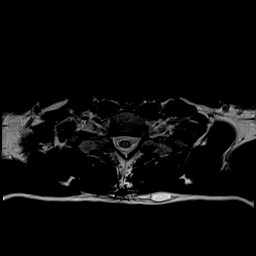
[im 10/34]
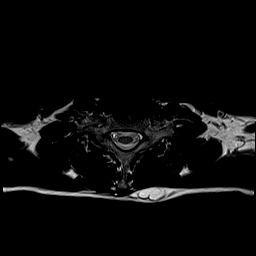
[im 14/34]
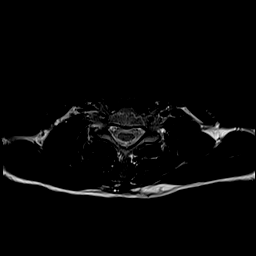
[im 17/34]
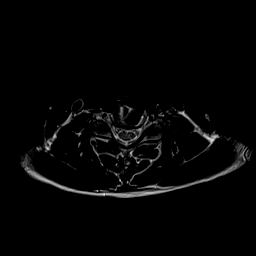
[im 20/34]
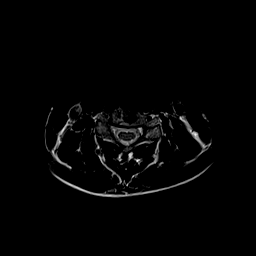
[im 24/34]
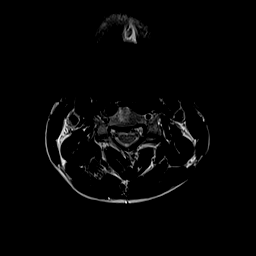
[im 27/34]
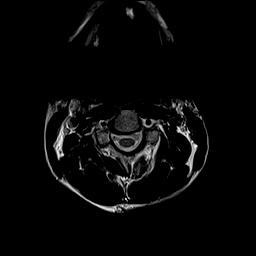
[im 30/34]
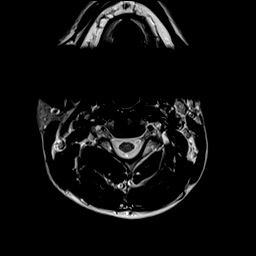
[im 34/34]
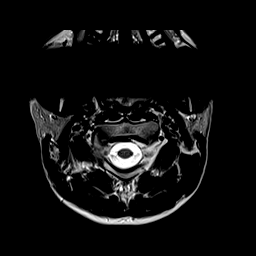

[Series 10: T1 · axial · 3.0mm · 0.35mm/px · z∈[-35,+79]mm · 11 of 34 slices shown (2 of 2)]
[im 1/34]
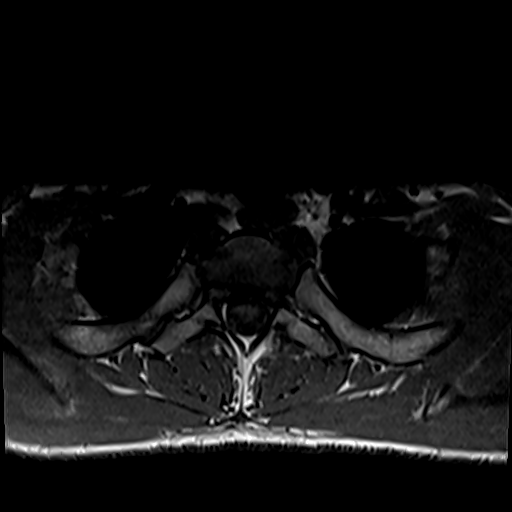
[im 4/34]
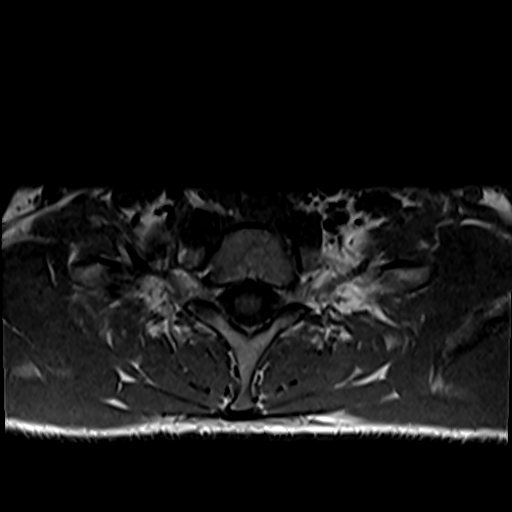
[im 7/34]
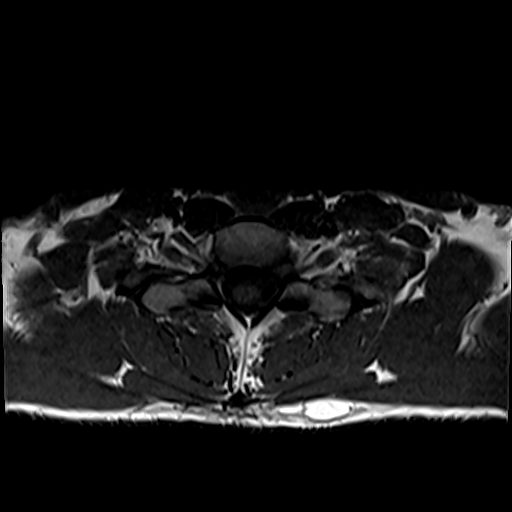
[im 10/34]
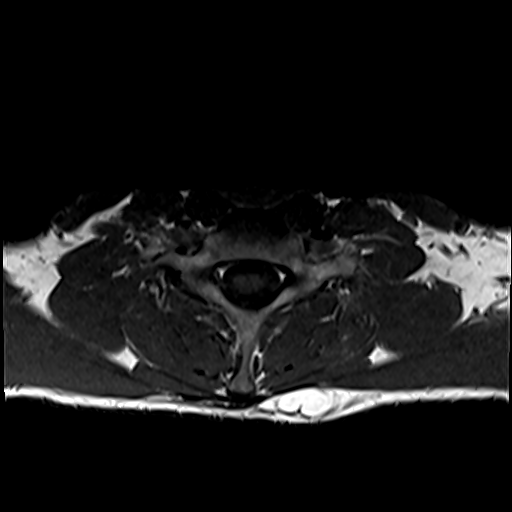
[im 14/34]
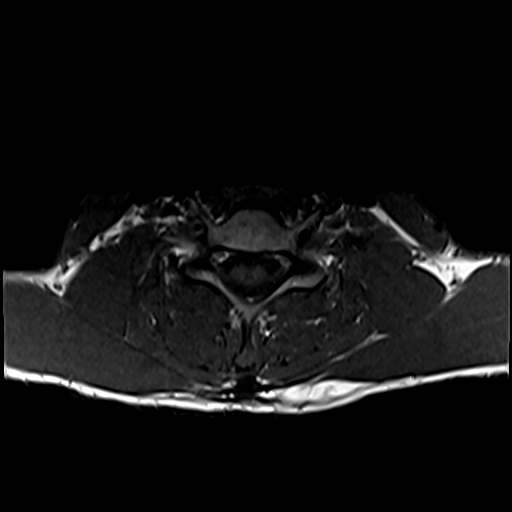
[im 17/34]
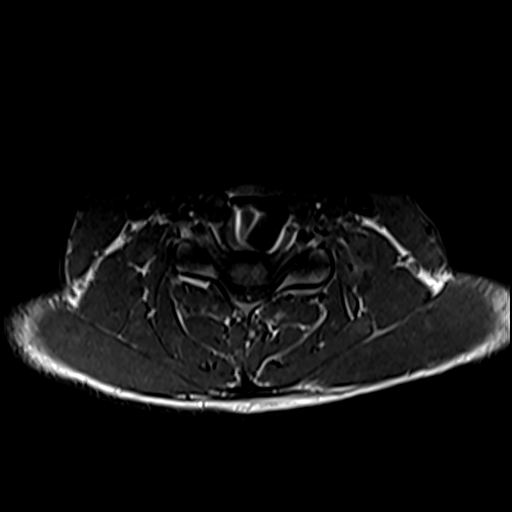
[im 20/34]
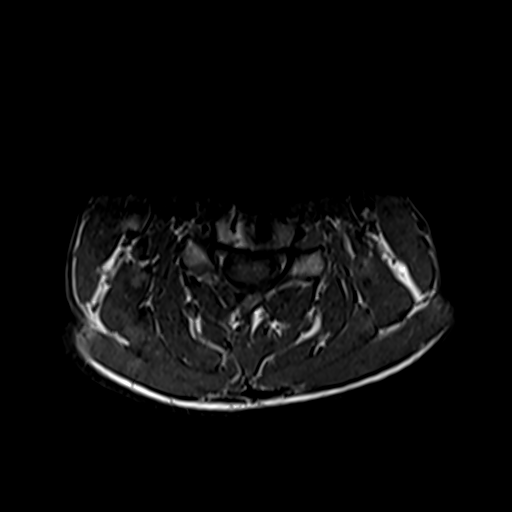
[im 24/34]
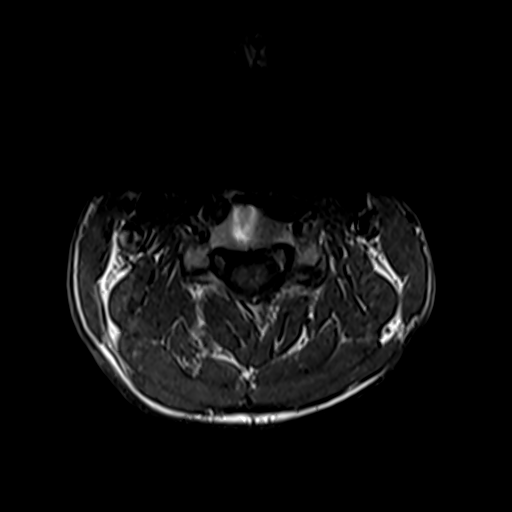
[im 27/34]
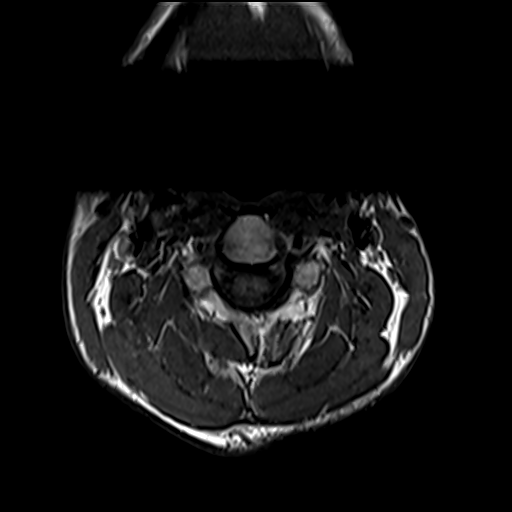
[im 30/34]
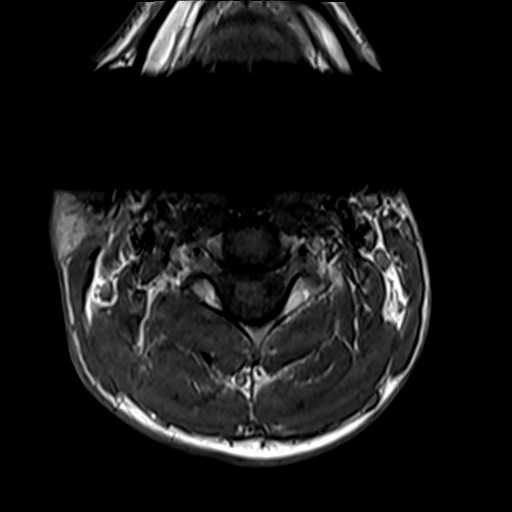
[im 34/34]
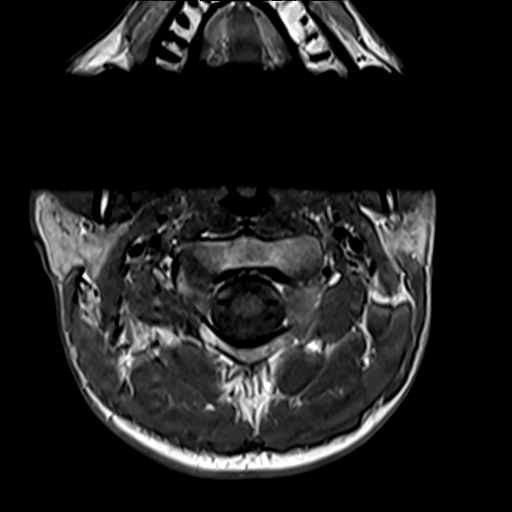

[36 of 48 positions shown; findings below may reference images not displayed]

FINDINGS: Alignment: Normal

Vertebrae: No fracture, evidence of discitis, or bone lesion.

Cord: Normal signal and morphology.

Posterior Fossa, vertebral arteries, paraspinal tissues: Simple
lipoma in the paramedian left posterior subcutaneous space which
measures 3.3 cm craniocaudal and 1 cm in thickness.

Disc levels:

C4-5 ACDF with solid arthrodesis.

C3-4 small central disc protrusion and mild left facet spurring.

No neural compression.
IMPRESSION: 1. No neural impingement.
2. C4-5 solid arthrodesis.

## 2021-05-19 ENCOUNTER — Emergency Department (HOSPITAL_BASED_OUTPATIENT_CLINIC_OR_DEPARTMENT_OTHER): Payer: Self-pay

## 2021-05-19 ENCOUNTER — Other Ambulatory Visit: Payer: Self-pay

## 2021-05-19 ENCOUNTER — Encounter (HOSPITAL_BASED_OUTPATIENT_CLINIC_OR_DEPARTMENT_OTHER): Payer: Self-pay | Admitting: *Deleted

## 2021-05-19 ENCOUNTER — Inpatient Hospital Stay (HOSPITAL_BASED_OUTPATIENT_CLINIC_OR_DEPARTMENT_OTHER)
Admission: EM | Admit: 2021-05-19 | Discharge: 2021-05-22 | DRG: 897 | Disposition: A | Discharge status: Other Acute Inpatient Hospital | Payer: Self-pay | Attending: Internal Medicine | Admitting: Internal Medicine

## 2021-05-19 DIAGNOSIS — R569 Unspecified convulsions: Secondary | ICD-10-CM

## 2021-05-19 DIAGNOSIS — F1090 Alcohol use, unspecified, uncomplicated: Secondary | ICD-10-CM | POA: Diagnosis present

## 2021-05-19 DIAGNOSIS — Z720 Tobacco use: Secondary | ICD-10-CM | POA: Diagnosis present

## 2021-05-19 DIAGNOSIS — Z9114 Patient's other noncompliance with medication regimen: Secondary | ICD-10-CM

## 2021-05-19 DIAGNOSIS — Y92008 Other place in unspecified non-institutional (private) residence as the place of occurrence of the external cause: Secondary | ICD-10-CM

## 2021-05-19 DIAGNOSIS — F1010 Alcohol abuse, uncomplicated: Secondary | ICD-10-CM

## 2021-05-19 DIAGNOSIS — S0181XA Laceration without foreign body of other part of head, initial encounter: Secondary | ICD-10-CM | POA: Diagnosis present

## 2021-05-19 DIAGNOSIS — F10229 Alcohol dependence with intoxication, unspecified: Secondary | ICD-10-CM | POA: Diagnosis present

## 2021-05-19 DIAGNOSIS — F17210 Nicotine dependence, cigarettes, uncomplicated: Secondary | ICD-10-CM | POA: Diagnosis present

## 2021-05-19 DIAGNOSIS — Z20822 Contact with and (suspected) exposure to covid-19: Secondary | ICD-10-CM | POA: Diagnosis present

## 2021-05-19 DIAGNOSIS — G4089 Other seizures: Secondary | ICD-10-CM | POA: Diagnosis present

## 2021-05-19 DIAGNOSIS — W08XXXA Fall from other furniture, initial encounter: Secondary | ICD-10-CM | POA: Diagnosis present

## 2021-05-19 DIAGNOSIS — F10239 Alcohol dependence with withdrawal, unspecified: Principal | ICD-10-CM | POA: Diagnosis present

## 2021-05-19 DIAGNOSIS — R45851 Suicidal ideations: Secondary | ICD-10-CM

## 2021-05-19 HISTORY — DX: Alcohol abuse, uncomplicated: F10.10

## 2021-05-19 LAB — COMPREHENSIVE METABOLIC PANEL
ALT: 22 U/L (ref 0–44)
AST: 33 U/L (ref 15–41)
Albumin: 4.3 g/dL (ref 3.5–5.0)
Alkaline Phosphatase: 74 U/L (ref 38–126)
Anion gap: 10 (ref 5–15)
BUN: 8 mg/dL (ref 6–20)
CO2: 26 mmol/L (ref 22–32)
Calcium: 9.7 mg/dL (ref 8.9–10.3)
Chloride: 99 mmol/L (ref 98–111)
Creatinine, Ser: 1.13 mg/dL (ref 0.61–1.24)
GFR, Estimated: >60 mL/min (ref >60)
Glucose, Bld: 92 mg/dL (ref 70–99)
Potassium: 4.2 mmol/L (ref 3.5–5.1)
Sodium: 135 mmol/L (ref 135–145)
Total Bilirubin: 0.7 mg/dL (ref 0.3–1.2)
Total Protein: 7.4 g/dL (ref 6.5–8.1)

## 2021-05-19 LAB — RESP PANEL BY RT-PCR (FLU A&B, COVID) ARPGX2
Influenza A by PCR: NEGATIVE
Influenza B by PCR: NEGATIVE
SARS Coronavirus 2 by RT PCR: NEGATIVE

## 2021-05-19 LAB — CBC
HCT: 47.6 % (ref 39.0–52.0)
Hemoglobin: 16.2 g/dL (ref 13.0–17.0)
MCH: 30.9 pg (ref 26.0–34.0)
MCHC: 34 g/dL (ref 30.0–36.0)
MCV: 90.8 fL (ref 80.0–100.0)
Platelets: 187 10*3/uL (ref 150–400)
RBC: 5.24 MIL/uL (ref 4.22–5.81)
RDW: 14 % (ref 11.5–15.5)
WBC: 9.1 10*3/uL (ref 4.0–10.5)
nRBC: 0 % (ref 0.0–0.2)

## 2021-05-19 LAB — RAPID URINE DRUG SCREEN, HOSP PERFORMED
Amphetamines: NOT DETECTED
Barbiturates: NOT DETECTED
Benzodiazepines: POSITIVE — AB
Cocaine: NOT DETECTED
Opiates: NOT DETECTED
Tetrahydrocannabinol: POSITIVE — AB

## 2021-05-19 LAB — SALICYLATE LEVEL: Salicylate Lvl: <7 mg/dL — ABNORMAL LOW (ref 7.0–30.0)

## 2021-05-19 LAB — ACETAMINOPHEN LEVEL: Acetaminophen (Tylenol), Serum: <10 ug/mL — ABNORMAL LOW (ref 10–30)

## 2021-05-19 LAB — ETHANOL: Alcohol, Ethyl (B): <10 mg/dL (ref <10)

## 2021-05-19 IMAGING — CT CT MAXILLOFACIAL W/O CM
3 series · 16 of 47 positions shown, 19 images · non-contrast
Comparison: None.

CLINICAL DATA: Jaw pain after fall

EXAM:
CT MAXILLOFACIAL WITHOUT CONTRAST
TECHNIQUE: Multidetector CT imaging of the maxillofacial structures was
performed. Multiplanar CT image reconstructions were also generated.

[Series 2: max soft · axial · 0.41mm/px · z∈[-486,-344]mm · 10 of 83 slices shown, 13 images]
[im 6/83  brain]
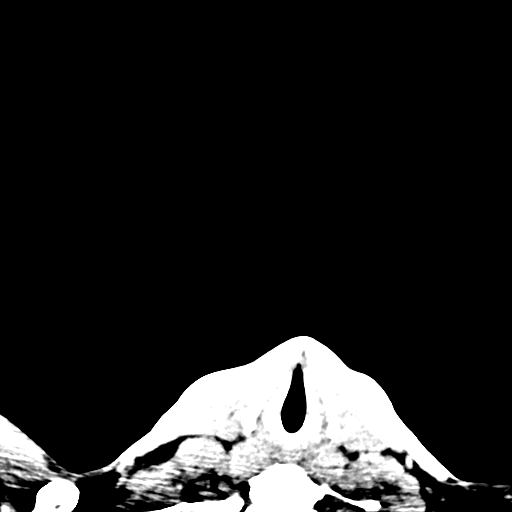
[im 6/83  bone]
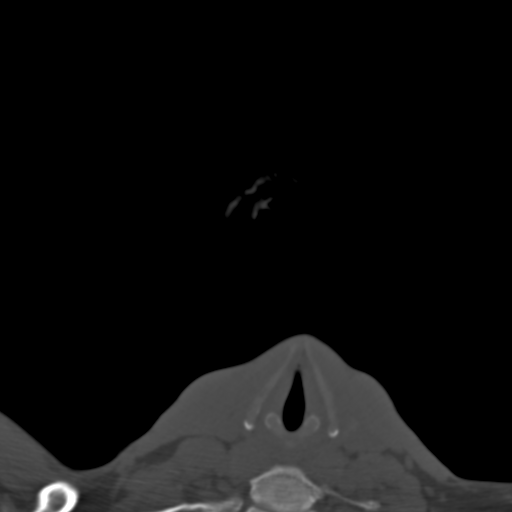
[im 15/83  bone]
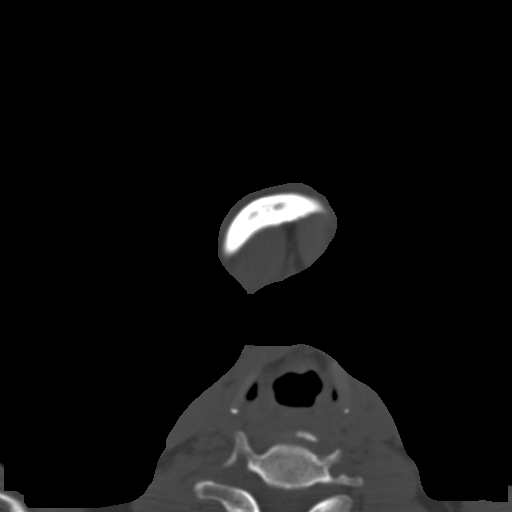
[im 23/83  bone]
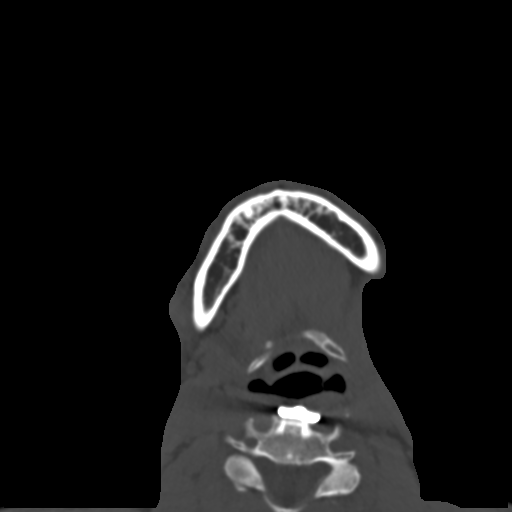
[im 29/83  bone]
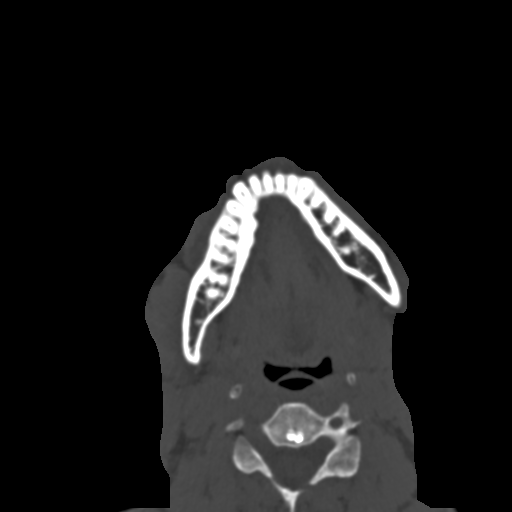
[im 37/83  brain]
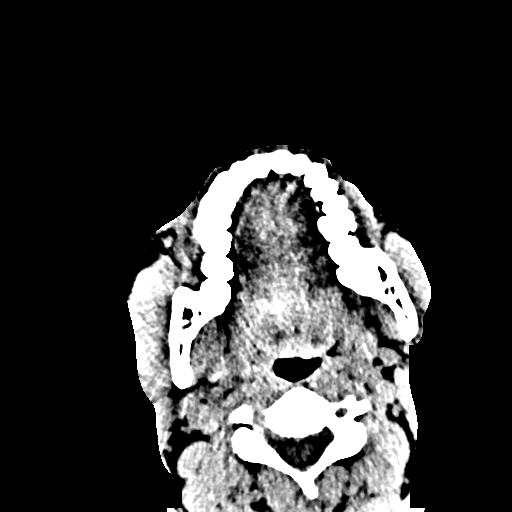
[im 37/83  bone]
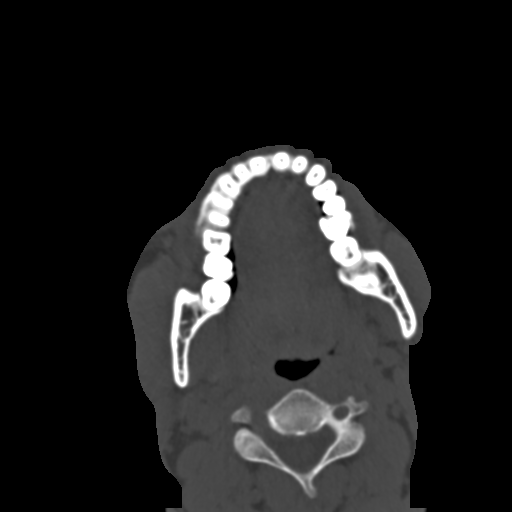
[im 46/83  bone]
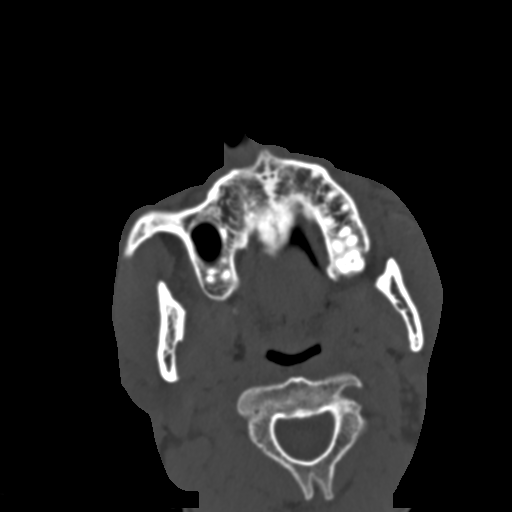
[im 54/83  bone]
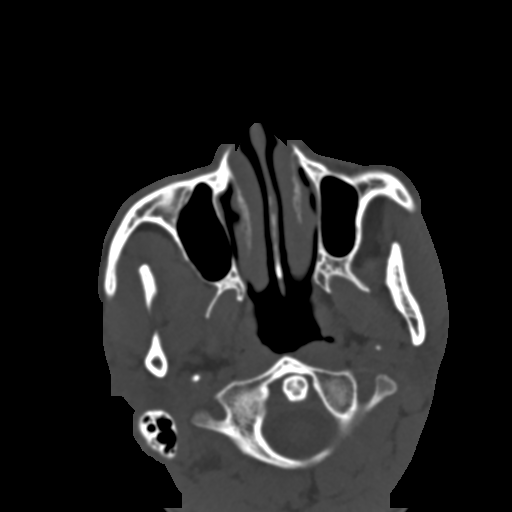
[im 63/83  bone]
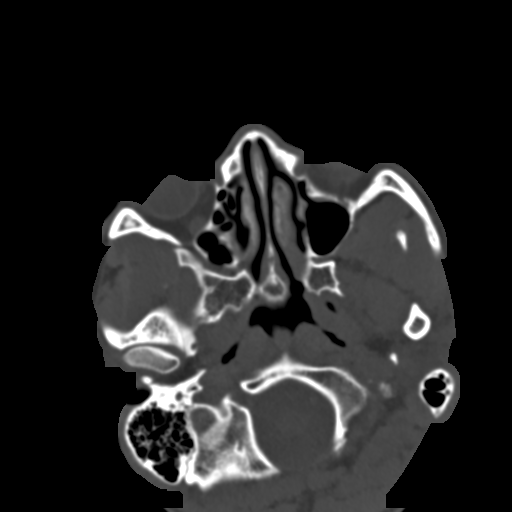
[im 68/83  brain]
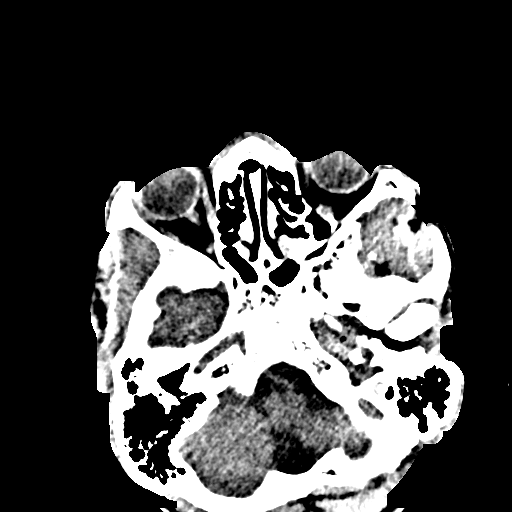
[im 68/83  bone]
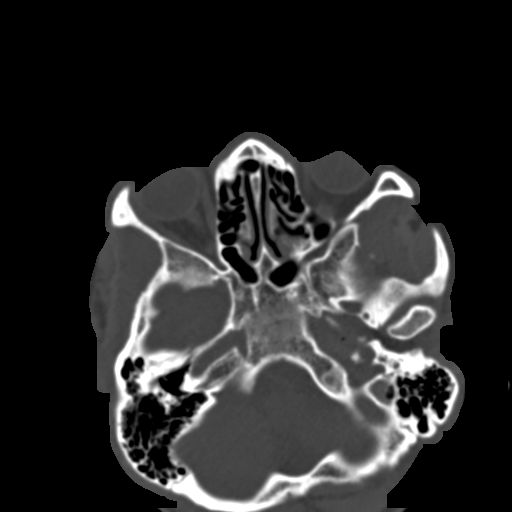
[im 77/83  bone]
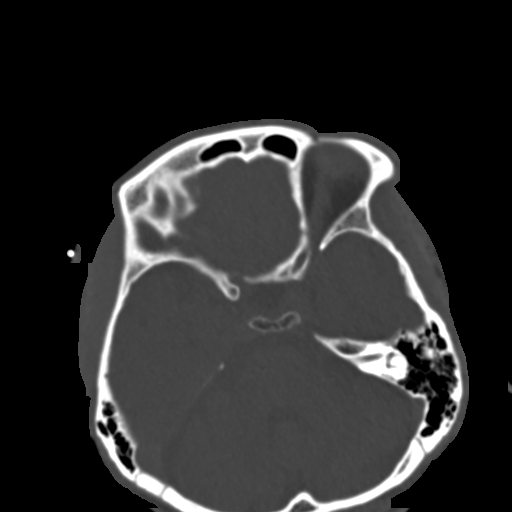

[Series 4: coronal soft · coronal · 0.34mm/px · 3 of 57 slices shown]
[im 19/57  bone]
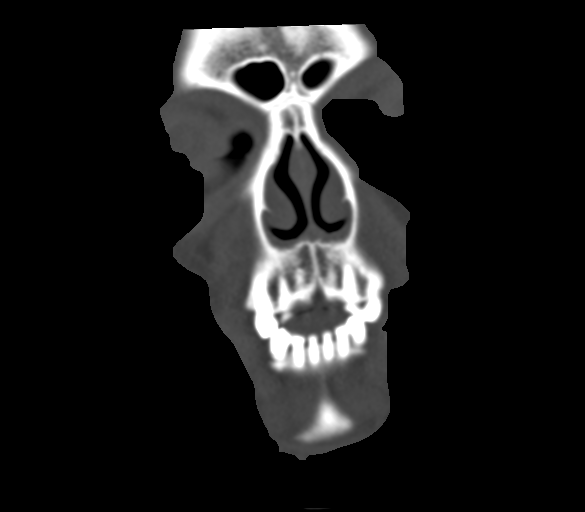
[im 25/57  bone]
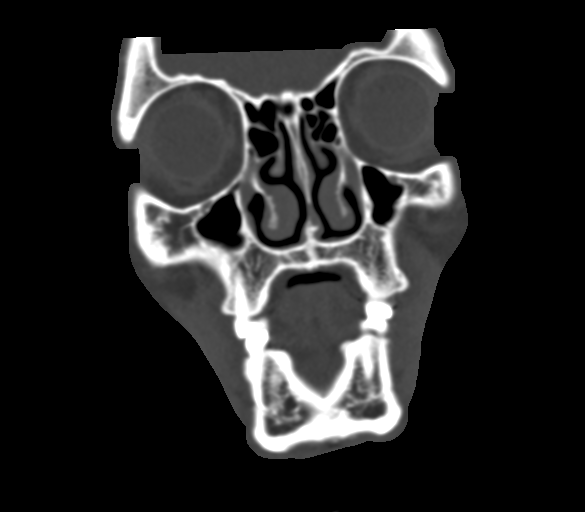
[im 32/57  bone]
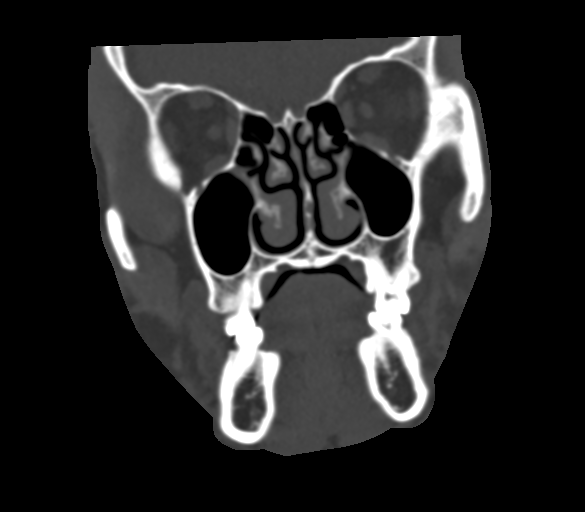

[Series 6: sagittal soft · sagittal · 0.35mm/px · 3 of 95 slices shown]
[im 32/95  bone]
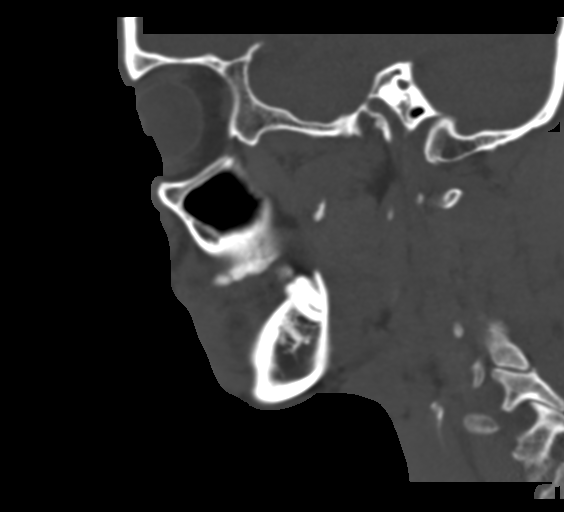
[im 48/95  bone]
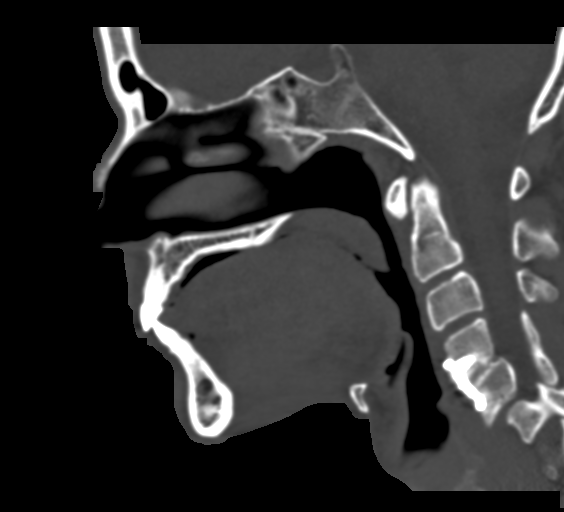
[im 63/95  bone]
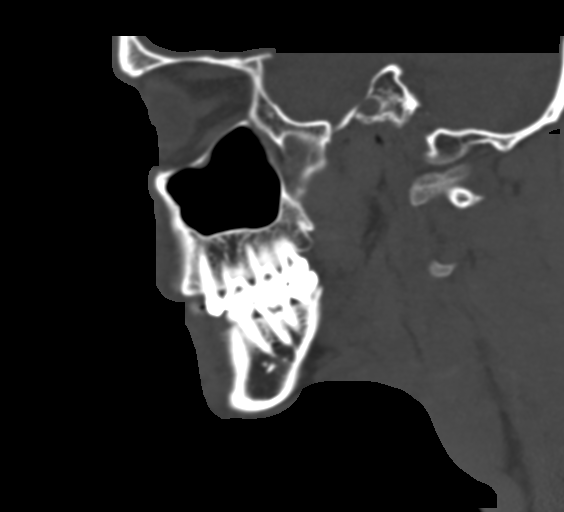

[16 of 47 positions shown; findings below may reference images not displayed]

FINDINGS: Osseous: No fracture or mandibular dislocation. No destructive
process.

Orbits: Negative. No traumatic or inflammatory finding.

Sinuses: Clear.

Soft tissues: Negative.

Limited intracranial: No significant or unexpected finding.
IMPRESSION: 1. No acute facial bone fracture. Unremarkable appearance of the
mandible.

## 2021-05-19 IMAGING — CT CT CERVICAL SPINE W/O CM
5 series · 16 of 33 positions shown, 18 images · non-contrast
Comparison: [DATE]

CLINICAL DATA: Fell, laceration under chin

EXAM:
CT CERVICAL SPINE WITHOUT CONTRAST
TECHNIQUE: Multidetector CT imaging of the cervical spine was performed without
intravenous contrast. Multiplanar CT image reconstructions were also
generated.

[Series 3: c spine soft · axial · 0.32mm/px · z∈[-307,-257]mm · 2 of 76 slices shown]
[im 26/76  soft-tissue]
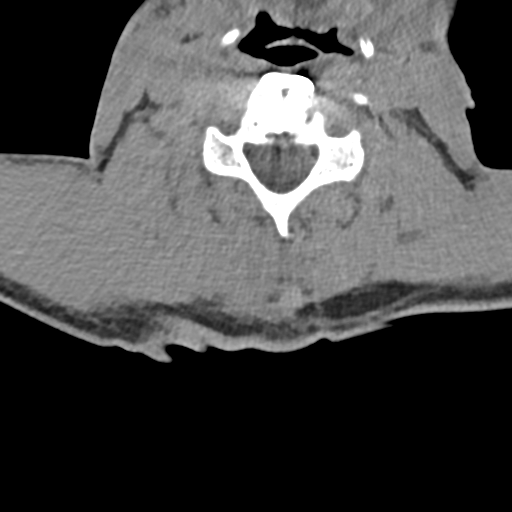
[im 51/76  soft-tissue]
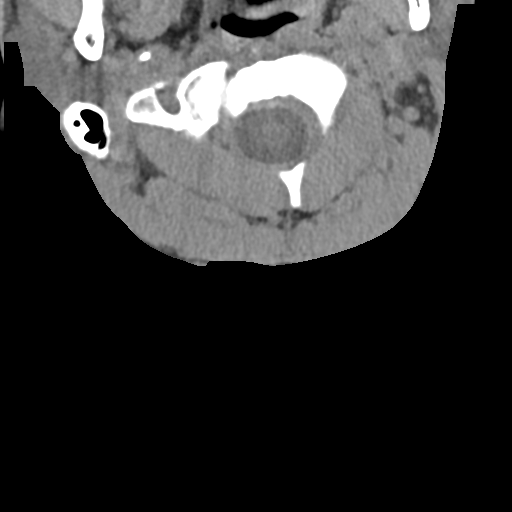

[Series 5: c spine soft 2 · axial · 0.32mm/px · z∈[-318,-244]mm · 3 of 75 slices shown]
[im 19/75  soft-tissue]
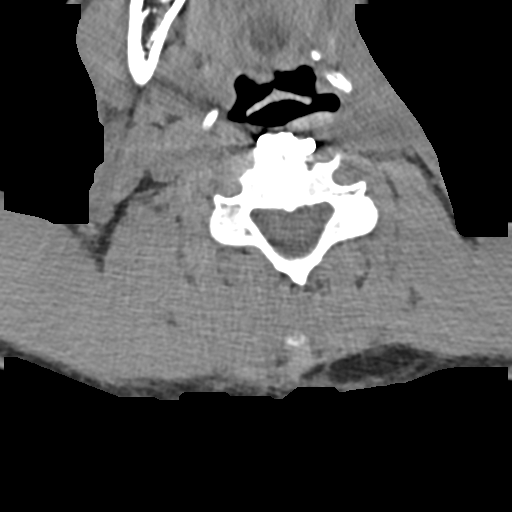
[im 38/75  soft-tissue]
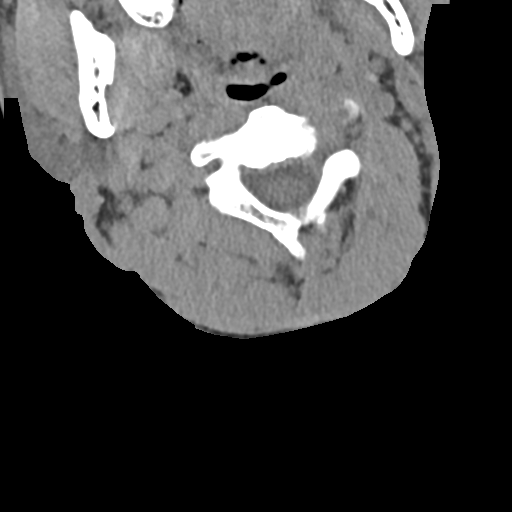
[im 56/75  soft-tissue]
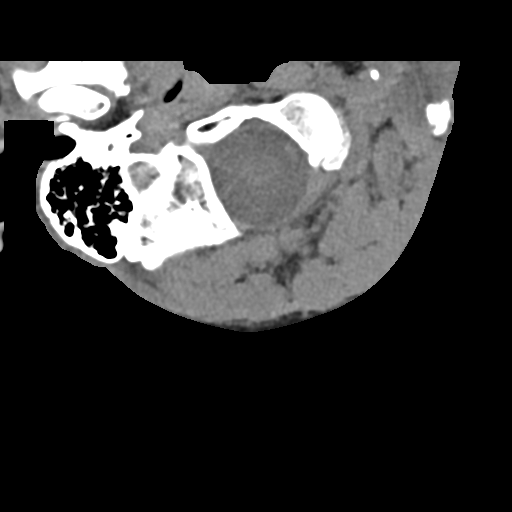

[Series 6: sagittal bone · sagittal · 0.28mm/px · 5 of 56 slices shown, 6 images]
[im 19/56  bone]
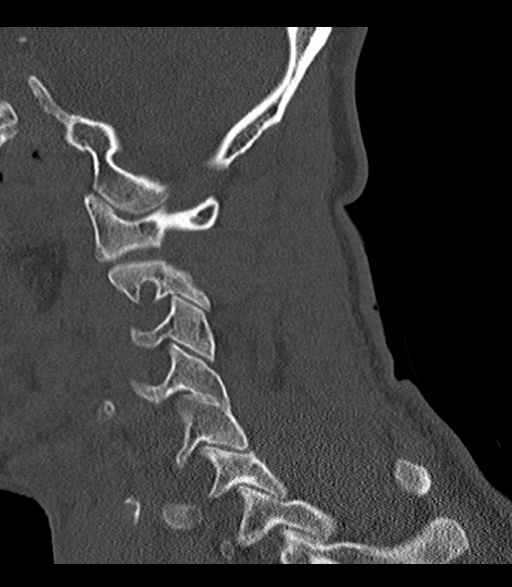
[im 23/56  bone]
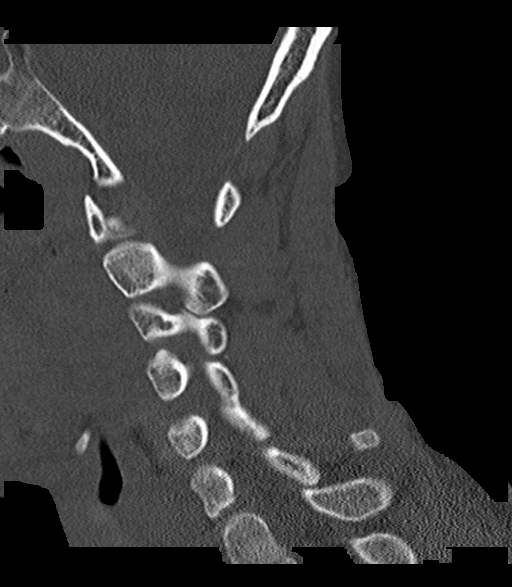
[im 28/56  soft-tissue]
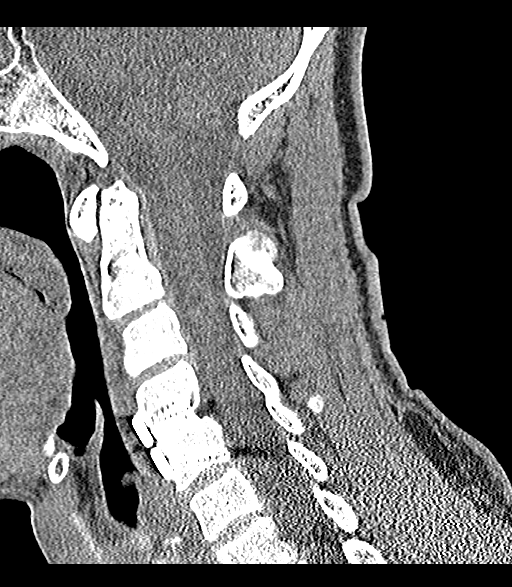
[im 28/56  bone]
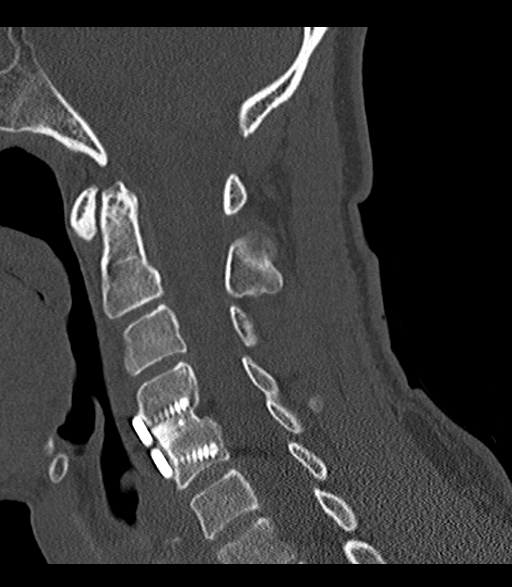
[im 33/56  bone]
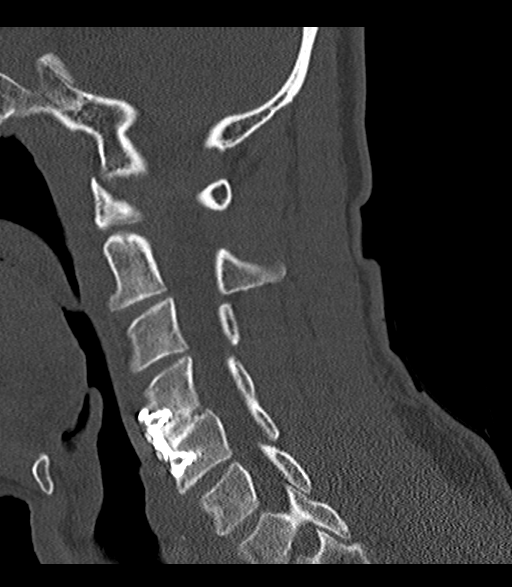
[im 37/56  bone]
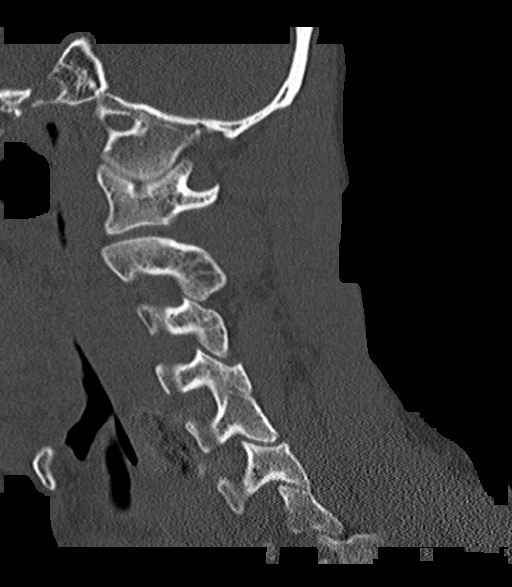

[Series 8: coronal bone · coronal · 0.23mm/px · 3 of 55 slices shown]
[im 11/55  bone]
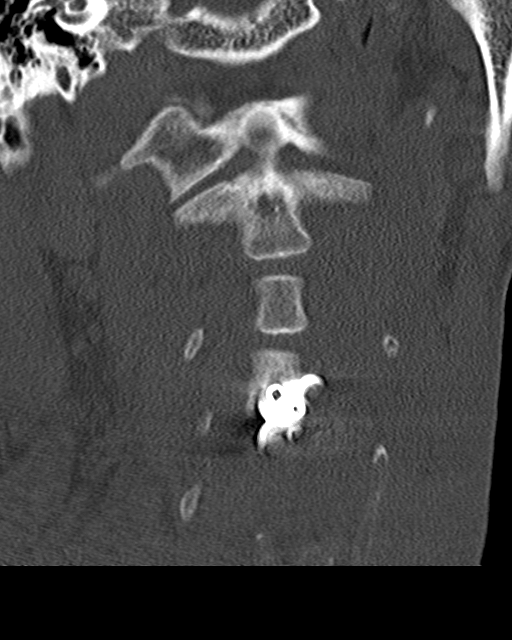
[im 22/55  bone]
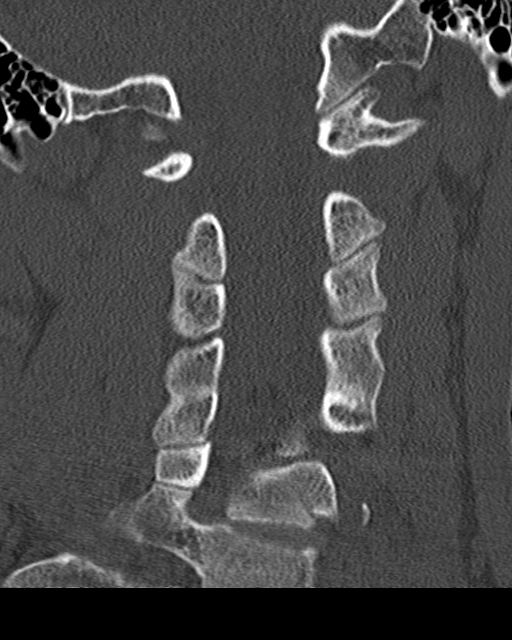
[im 33/55  bone]
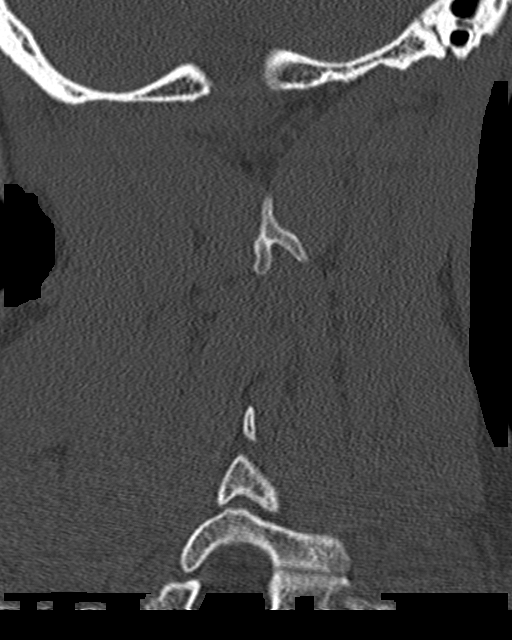

[Series 10: orthogonal bone · axial · 0.23mm/px · z∈[-359,-293]mm · 3 of 74 slices shown, 4 images]
[im 19/74  soft-tissue]
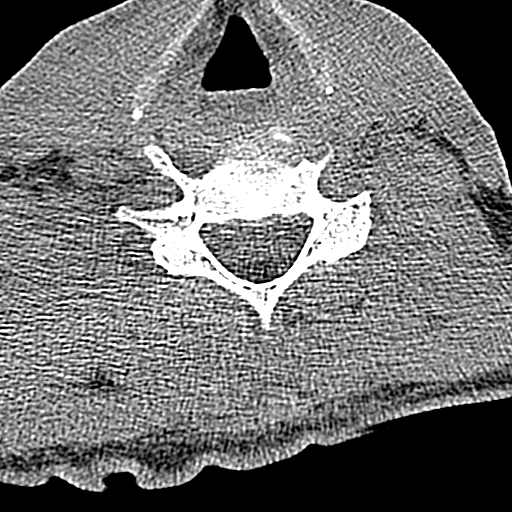
[im 19/74  bone]
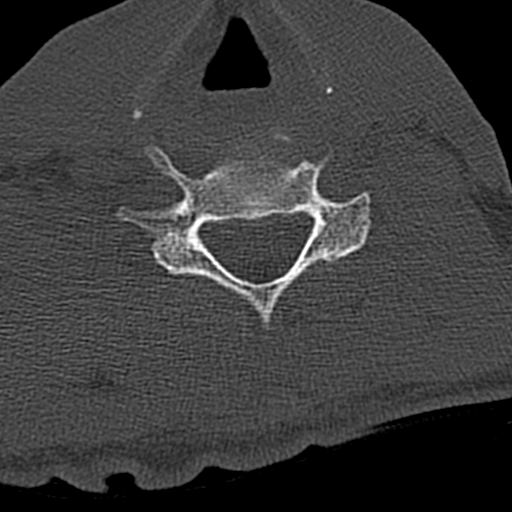
[im 37/74  bone]
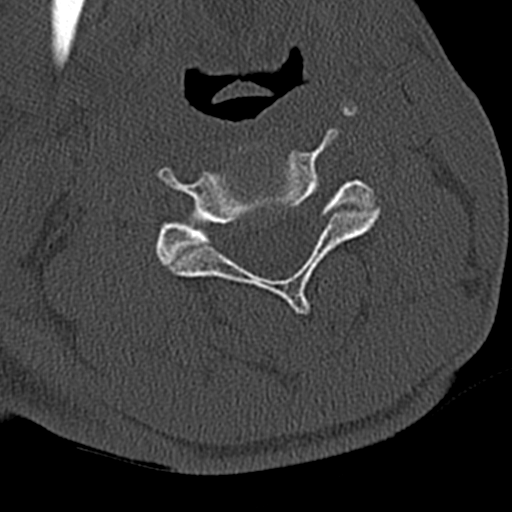
[im 55/74  bone]
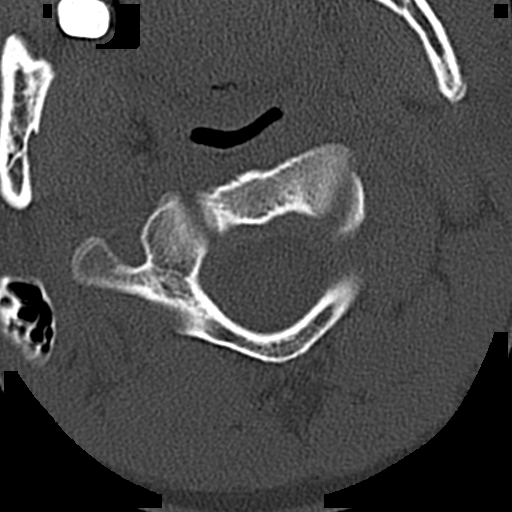

[16 of 33 positions shown; findings below may reference images not displayed]

FINDINGS: Alignment: Alignment is grossly anatomic.

Skull base and vertebrae: No acute fracture. No primary bone lesion
or focal pathologic process. Incidental cervical ribs at C7 again
noted.

Soft tissues and spinal canal: No prevertebral fluid or swelling. No
visible canal hematoma.

Disc levels: Stable C4-5 ACDF. Nonsurgical disc spaces are
relatively well preserved.

Upper chest: Unremarkable.

Other: Reconstructed images demonstrate no additional findings.
IMPRESSION: 1. No acute cervical spine fracture.

## 2021-05-19 IMAGING — CT CT HEAD W/O CM
3 series · 14 of 47 positions shown, 16 images · non-contrast
Comparison: [DATE]

CLINICAL DATA: Drinking continuously since [REDACTED], intoxicated,
suicidal ideations, found on floor in hallway, unwitnessed fall,
slurred speech

EXAM:
CT HEAD WITHOUT CONTRAST
TECHNIQUE: Contiguous axial images were obtained from the base of the skull
through the vertex without intravenous contrast. Sagittal and
coronal MPR images reconstructed from axial data set.

[Series 2: head wo · axial · 0.43mm/px · z∈[-372,-236]mm · 8 of 33 slices shown, 10 images]
[im 3/33  brain]
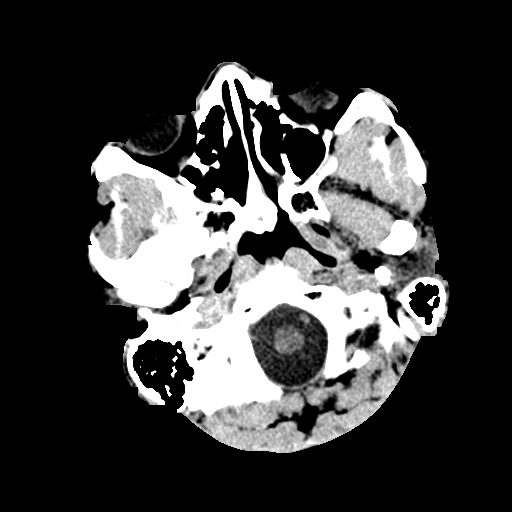
[im 3/33  bone]
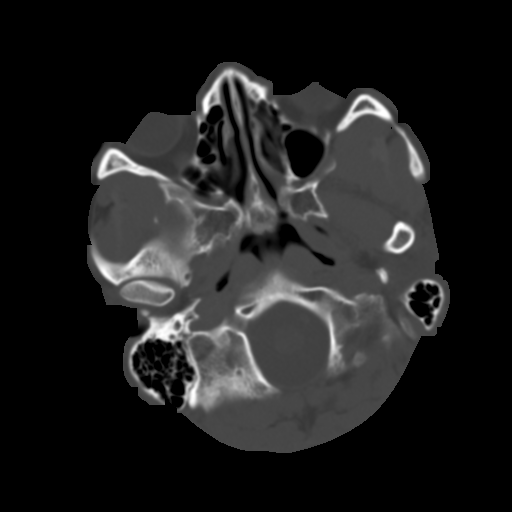
[im 7/33  brain]
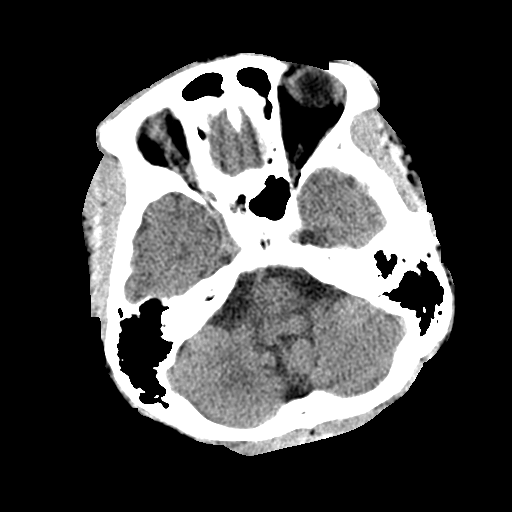
[im 10/33  brain]
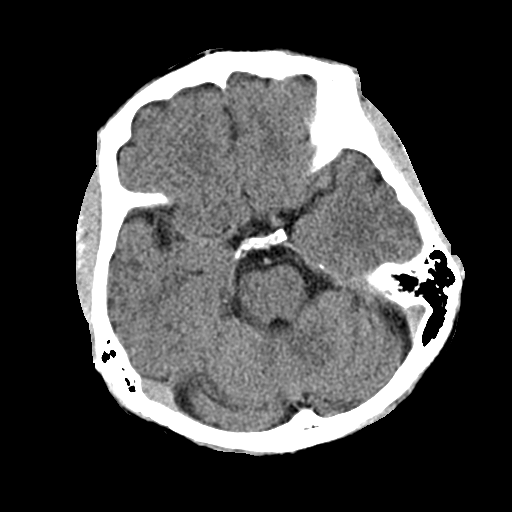
[im 15/33  brain]
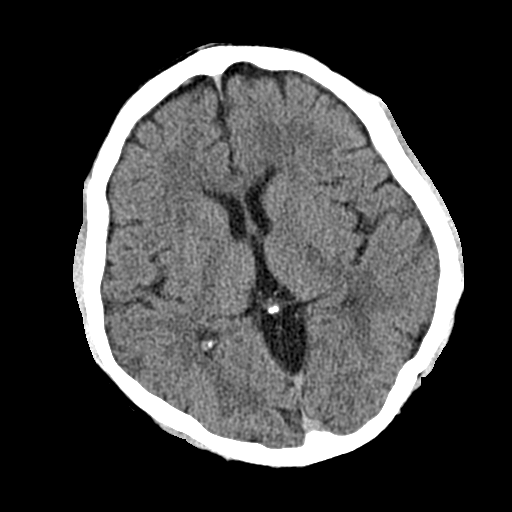
[im 18/33  brain]
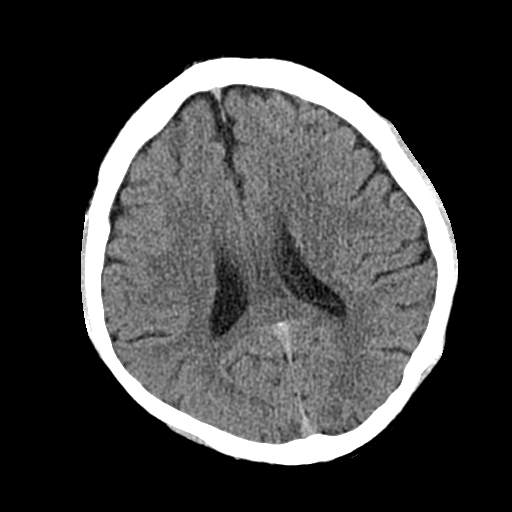
[im 18/33  bone]
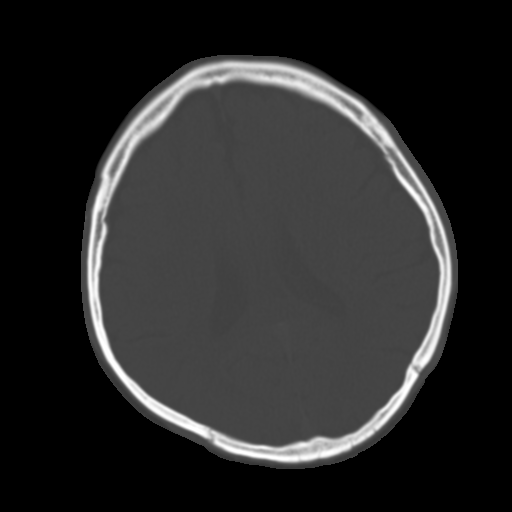
[im 23/33  brain]
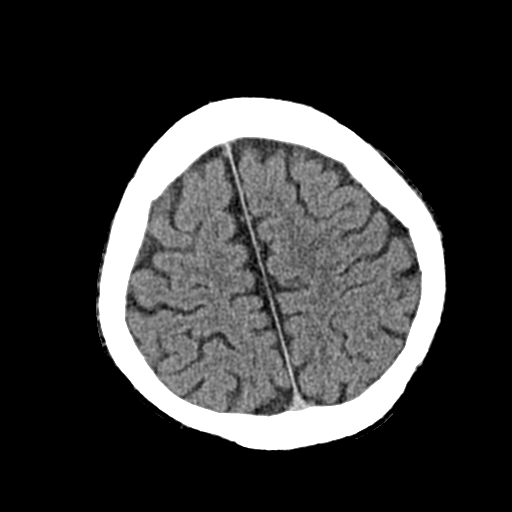
[im 26/33  brain]
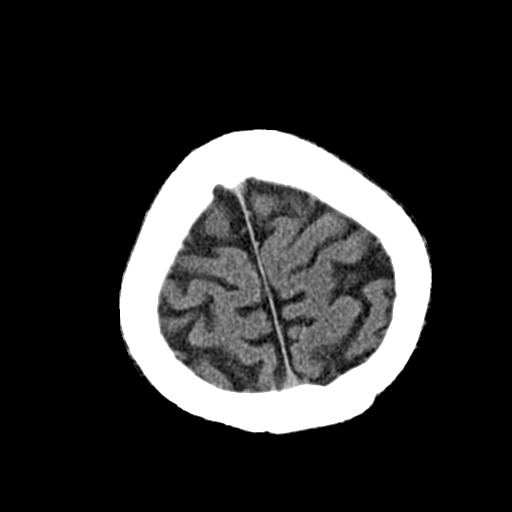
[im 30/33  brain]
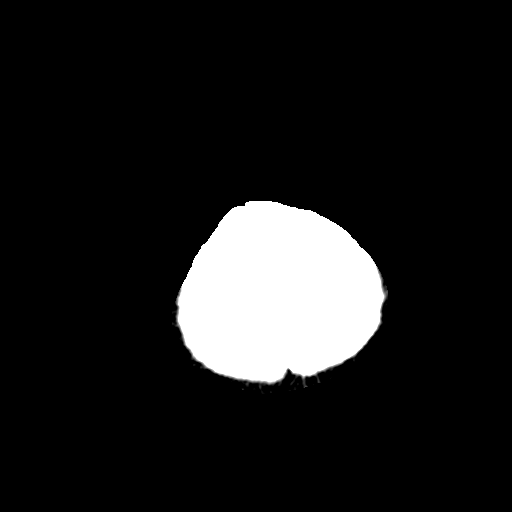

[Series 4: cor head wo · coronal · 0.32mm/px · 3 of 69 slices shown]
[im 23/69  brain]
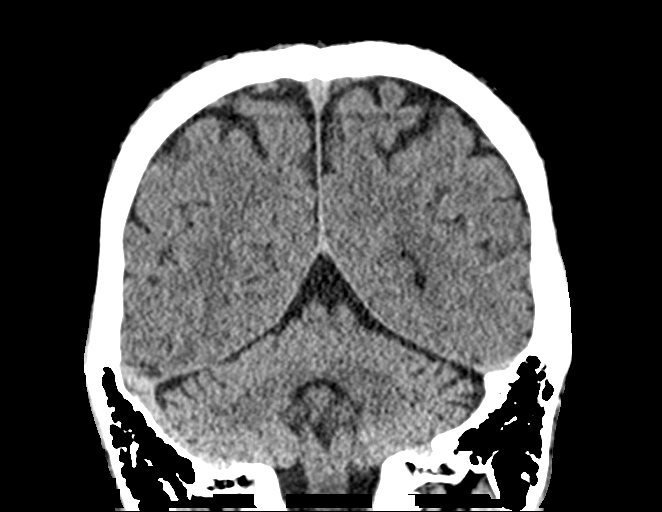
[im 31/69  brain]
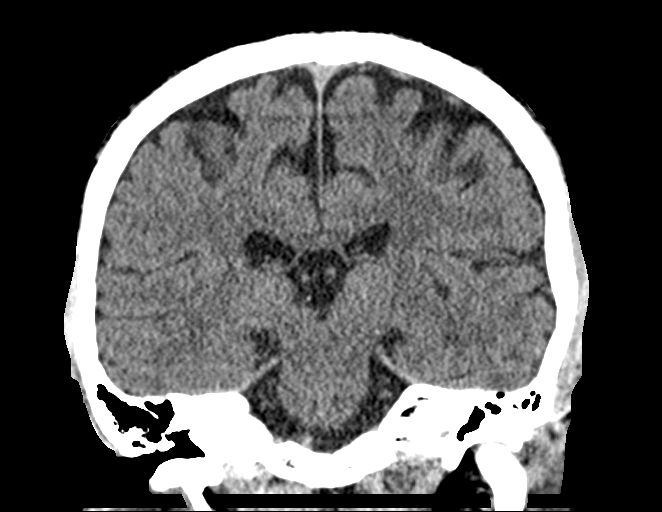
[im 38/69  brain]
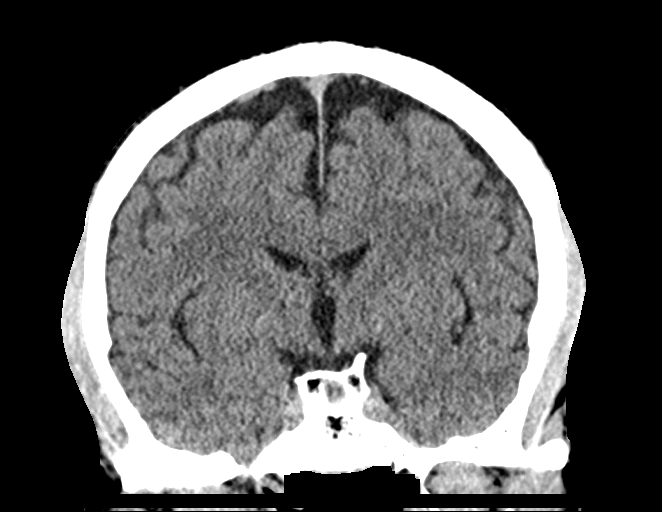

[Series 5: sag head wo · sagittal · 0.32mm/px · 3 of 62 slices shown]
[im 21/62  brain]
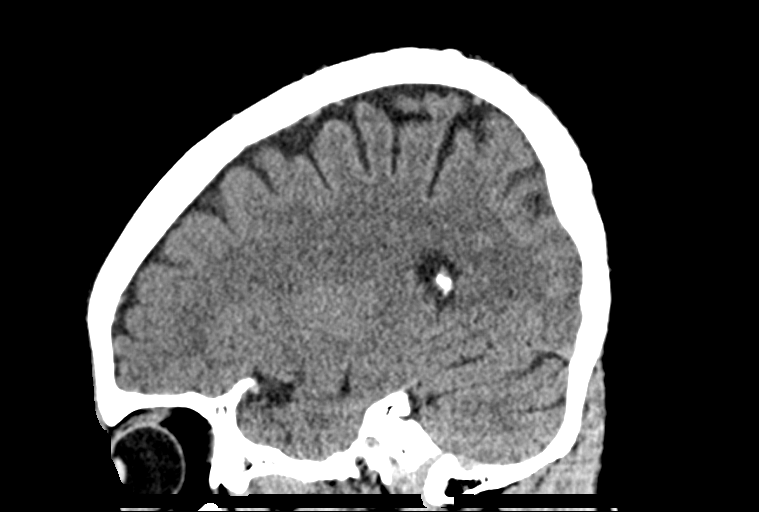
[im 31/62  brain]
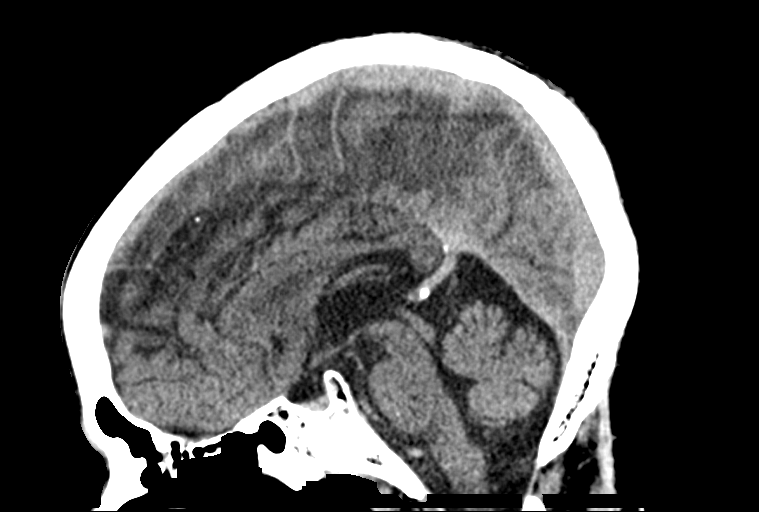
[im 41/62  brain]
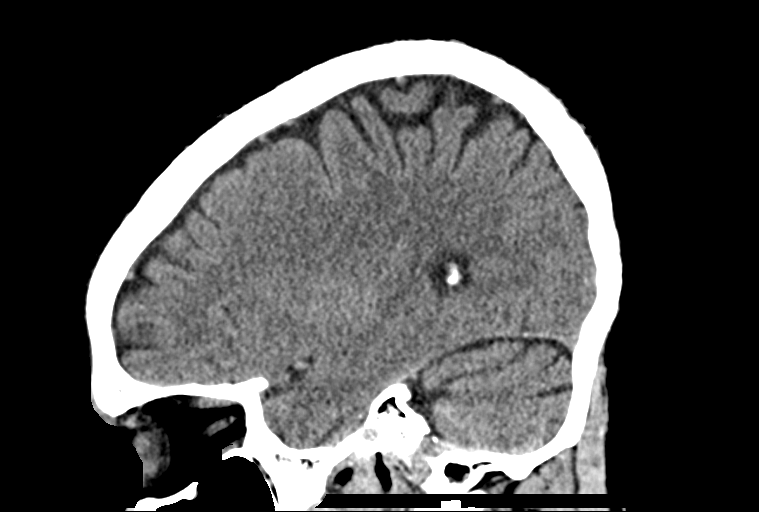

[14 of 47 positions shown; findings below may reference images not displayed]

FINDINGS: Brain: Normal ventricular morphology. No midline shift or mass
effect. Normal appearance of brain parenchyma. No intracranial
hemorrhage, mass lesion, or evidence of acute infarction. No
extra-axial fluid collections.

Vascular: No hyperdense vessels

Skull: Intact

Sinuses/Orbits: Clear

Other: N/A
IMPRESSION: Normal exam.

## 2021-05-19 MED ORDER — LORAZEPAM 2 MG/ML IJ SOLN: 0.0000 mg | Freq: Two times a day (BID) | INTRAMUSCULAR | Status: DC

## 2021-05-19 MED ORDER — LORAZEPAM 1 MG PO TABS: 1.0000 mg | ORAL_TABLET | ORAL | Status: AC | PRN

## 2021-05-19 MED ORDER — LORAZEPAM 2 MG/ML IJ SOLN: 1.0000 mg | INTRAMUSCULAR | Status: AC | PRN

## 2021-05-19 MED ORDER — LORAZEPAM 2 MG/ML IJ SOLN: 1.0000 mg | INTRAMUSCULAR | Status: DC | PRN

## 2021-05-19 MED ORDER — LEVETIRACETAM IN NACL 1000 MG/100ML IV SOLN
1000.0000 mg | Freq: Once | INTRAVENOUS | Status: AC
  Administered 2021-05-19: 1000 mg via INTRAVENOUS
  Filled 2021-05-19: qty 100

## 2021-05-19 MED ORDER — LIDOCAINE HCL 2 % IJ SOLN
20.0000 mL | Freq: Once | INTRAMUSCULAR | Status: AC
  Administered 2021-05-19: 400 mg
  Filled 2021-05-19: qty 20

## 2021-05-19 MED ORDER — THIAMINE HCL 100 MG/ML IJ SOLN
100.0000 mg | Freq: Every day | INTRAMUSCULAR | Status: DC
  Filled 2021-05-19: qty 2

## 2021-05-19 MED ORDER — SODIUM CHLORIDE 0.9 % IV BOLUS
1000.0000 mL | Freq: Once | INTRAVENOUS | Status: AC
  Administered 2021-05-19: 1000 mL via INTRAVENOUS

## 2021-05-19 MED ORDER — ACETAMINOPHEN 650 MG RE SUPP: 650.0000 mg | Freq: Four times a day (QID) | RECTAL | Status: DC | PRN

## 2021-05-19 MED ORDER — LORAZEPAM 2 MG/ML IJ SOLN
0.0000 mg | Freq: Four times a day (QID) | INTRAMUSCULAR | Status: AC
Start: 2021-05-19 — End: 2021-05-21

## 2021-05-19 MED ORDER — ADULT MULTIVITAMIN W/MINERALS CH
1.0000 | ORAL_TABLET | Freq: Every day | ORAL | Status: DC
  Administered 2021-05-19 – 2021-05-22 (×4): 1 via ORAL
  Filled 2021-05-19 (×4): qty 1

## 2021-05-19 MED ORDER — ACETAMINOPHEN 325 MG PO TABS
650.0000 mg | ORAL_TABLET | Freq: Four times a day (QID) | ORAL | Status: DC | PRN
  Administered 2021-05-22: 650 mg via ORAL
  Filled 2021-05-19: qty 2

## 2021-05-19 MED ORDER — THIAMINE HCL 100 MG PO TABS
100.0000 mg | ORAL_TABLET | Freq: Every day | ORAL | Status: DC
  Administered 2021-05-19 – 2021-05-22 (×4): 100 mg via ORAL
  Filled 2021-05-19 (×4): qty 1

## 2021-05-19 MED ORDER — FOLIC ACID 1 MG PO TABS
1.0000 mg | ORAL_TABLET | Freq: Every day | ORAL | Status: DC
  Administered 2021-05-19 – 2021-05-22 (×4): 1 mg via ORAL
  Filled 2021-05-19 (×4): qty 1

## 2021-05-19 NOTE — H&P (Signed)
History and Physical    PLEASE NOTE THAT DRAGON DICTATION SOFTWARE WAS USED IN THE CONSTRUCTION OF THIS NOTE.   Ryan Stark PNT:[id number] DOB: 18-Sep-1985 DOA: 05/19/2021  PCP: Default, Provider, MD Patient coming from: home   I have personally briefly reviewed patient's old medical records in [facility]  Chief Complaint: seizure  HPI: Ryan Stark is a 35 y.o. male with medical history significant for chronic alcohol abuse, alcohol withdrawal seizures, chronic tobacco abuse, who is admitted to [facility] on 05/19/2021 by way of transfer from [facility] ED for further evaluation and management of seizures, with which he presented from home to the latter facility.   The following history is provided by the patient, patient's significant other, eating the and via chart review.  Patient's girlfriend reportedly heard a loud thump at home, and within a few seconds found the patient on the floor, demonstrating reported evidence of "shaking" involving that the bilateral upper extremities.  She reports that this episode lasted for less than 1 minute before spontaneously resolving without any observation of additional convulsant behavior.  There was reportedly a brief period of time following cessation of the above activity in which the patient was of diminished responsiveness, although a specific timeframe of this is not entirely clear.  Girlfriend also reportedly noted some blood on the floor where the patient had fallen, noting a small laceration to his chin that had not been present prior to him landing on the floor.  The patient conveys that he does not recall falling to the floor and does not recall injuring the above potential tonic-clonic activity.  The above episode was not associated with any tongue biting or loss of bowel/bladder function.  Leading up to falling to the floor at home earlier today, patient denies any recent preceding trauma.  No subsequent witnessed  tonic-clonic activity, including none reported at [facility] today.  Patient has a history of chronic alcohol abuse, reportedly consuming at least 12 beers per day every day for at least the last 6 months, with reported most recent alcohol consumption occurring on 05/18/2021.  It appears the patient has a history of 1 prior seizure, which was felt to be alcohol withdrawal in nature, and without any interval suspected seizure-like activity leading up to today's events.  He is not on any antiepileptic medications at home.   Aside from this history of alcohol abuse, he denies any history of recreational drug use, aside from smoking marijuana,including no abuse of stimulant class recreational drugs, including no use of cocaine, methamphetamine, or amphetamines.  Denies any chronic or recent use of benzodiazepines, opioids, or antidepressant medications, include no recent use of SSRI's or SNRI's. No history of diabetes and does not take insulin or sulfonylureas as an outpatient.   No recent subjective fever, chills, rigors, or generalized myalgias.  No a/w any recent acute focal weakness, acute focal numbness, paresthesias, slurred speech, facial droop, vertigo, nausea, vomiting, or acute change in vision.  Denies any recent visual or auditory hallucination.  no recent reported headache, neck stiffness, denies any recent chest pain, shortness of breath, palpitations, diaphoresis, dizziness, or presyncope.  No recent cough, hemoptysis, abdominal pain, diarrhea, rash.  He also denies any recent dysuria, gross hematuria, or change in urinary urgency/frequency.     [facility] ED Course:  Vital signs in the ED were notable for the following: Tetramex 98.0, heart rate 6089, blood pressure 106/68 -115/66; respiratory rate 16-18, oxygen saturation 96  1% on room air.  Labs were notable for the following: CMP notable for sodium 135, bicarb 26, creatinine 1.13, glucose 22, and liver enzymes were  noted to be within normal limits.  Serum ethanol level less than 10.  Serum acetaminophen level less than 10.  Salicylate level less than 7.  Urinary drug screen was positive for cannabinoids as well as benzodiazepines.  CBC notable for white cell count 9100 COVID-19/Hunza PCR were checked in the ED today and found to be negative.  Imaging and additional notable ED work-up: No emergency detention evidence of acute intracranial process, including no evidence of intracranial hemorrhage.  CT maxillofacial showed no evidence of acute facial bone fracture.  CT cervical spine showed no evidence of acute cervical spine fracture.  The patient reportedly conveyed suicidal ideations to [facility] staff.  Subsequently psychiatry was consulted in order for one-to-one sitter placed.   On-call neurology was consulted and reportedly agree with Keppra loading, in the absence of any recommended need for ongoing Keppra dosing after that loading dose given suspicion for alcohol withdrawal as the underlying source of the patient's potential seizure.  Recommended admission to the hospitalist service at [facility] for further evaluation management of potential presenting seizure, suspected to be on the basis of alcohol withdrawal, without recommendation for EEG monitoring ,and conveyed that neurology is available on a as needed basis if patient develops any subsequent evidence of additional seizures.  While in the ED, the following were administered: Oral thiamine, folic acid, and oral multivitamin.  Keppra 1 g IV x1, normal saline x1 L bolus.  Additionally, the patient's chin laceration was closed by EDP via 5 sutures, with ensuing hemostasis reported.     Review of Systems: As per HPI otherwise 10 point review of systems negative.   Past Medical History:  Diagnosis Date   Alcohol abuse    Cervical spine fracture (HCC) 08/19/2016    Past Surgical History:  Procedure Laterality Date   ANTERIOR CERVICAL  DECOMP/DISCECTOMY FUSION N/A 08/20/2016   Procedure: ANTERIOR CERVICAL DECOMPRESSION/DISCECTOMY FUSION CERVICAL FOUR - CERVICAL FIVE;  Surgeon: Shirlean Kelly, MD;  Location: [facility] OR;  Service: Neurosurgery;  Laterality: N/A;  ANTERIOR CERVICAL DECOMPRESSION/DISCECTOMY FUSION CERVICAL FOUR - CERVICAL FIVE    Social History:  reports that he has been smoking cigarettes. He has never used smokeless tobacco. He reports current alcohol use. He reports that he does not currently use drugs after having used the following drugs: Marijuana.   No Known Allergies  Family history reviewed and not pertinent    Prior to Admission medications   Medication Sig Start Date End Date Taking? Authorizing Provider  Multiple Vitamin (MULTIVITAMIN WITH MINERALS) TABS tablet Take 1 tablet by mouth daily. Patient not taking: No sig reported 01/19/18   Shaune Pollack, MD     Objective    Physical Exam: Vitals:   05/19/21 1723 05/19/21 1927 05/19/21 2207 05/19/21 2304  BP: 115/66  113/67 104/61  Pulse: 64  89 75  Resp: 18  18 16   Temp:    98.4 F (36.9 C)  TempSrc:    Oral  SpO2: 100% 100% 100% 96%  Weight:    60.3 kg  Height:    6' (1.829 m)    General: appears to be stated age; alert, oriented Skin: warm, dry, no rash Head: Laceration on chin noted with associated hemostasis in the setting of noted sutures, otherwise AT/[place] Mouth:  Oral mucosa membranes appear moist, normal dentition Neck: supple; trachea midline  Heart:  RRR; did not appreciate any M/R/G Lungs: CTAB, did not appreciate any wheezes, rales, or rhonchi Abdomen: + BS; soft, ND, NT Vascular: 2+ pedal pulses b/l; 2+ radial pulses b/l Extremities: no peripheral edema, no muscle wasting Neuro: strength and sensation intact in upper and lower extremities b/l. No tremors     Labs on Admission: I have personally reviewed following labs and imaging studies  CBC: Recent Labs  Lab 05/19/21 1520  WBC 9.1  HGB 16.2  HCT 47.6  MCV  90.8  PLT 187   Basic Metabolic Panel: Recent Labs  Lab 05/19/21 1520  NA 135  K 4.2  CL 99  CO2 26  GLUCOSE 92  BUN 8  CREATININE 1.13  CALCIUM 9.7   GFR: Estimated Creatinine Clearance: 77.1 mL/min (by C-G formula based on SCr of 1.13 mg/dL). Liver Function Tests: Recent Labs  Lab 05/19/21 1520  AST 33  ALT 22  ALKPHOS 74  BILITOT 0.7  PROT 7.4  ALBUMIN 4.3   No results for input(s): LIPASE, AMYLASE in the last 168 hours. No results for input(s): AMMONIA in the last 168 hours. Coagulation Profile: No results for input(s): INR, PROTIME in the last 168 hours. Cardiac Enzymes: No results for input(s): CKTOTAL, CKMB, CKMBINDEX, TROPONINI in the last 168 hours. BNP (last 3 results) No results for input(s): PROBNP in the last 8760 hours. HbA1C: No results for input(s): HGBA1C in the last 72 hours. CBG: No results for input(s): GLUCAP in the last 168 hours. Lipid Profile: No results for input(s): CHOL, HDL, LDLCALC, TRIG, CHOLHDL, LDLDIRECT in the last 72 hours. Thyroid Function Tests: No results for input(s): TSH, T4TOTAL, FREET4, T3FREE, THYROIDAB in the last 72 hours. Anemia Panel: No results for input(s): VITAMINB12, FOLATE, FERRITIN, TIBC, IRON, RETICCTPCT in the last 72 hours. Urine analysis:    Component Value Date/Time   COLORURINE COLORLESS (A) 01/17/2018 2143   APPEARANCEUR CLEAR 01/17/2018 2143   LABSPEC 1.002 (L) 01/17/2018 2143   PHURINE 6.0 01/17/2018 2143   GLUCOSEU NEGATIVE 01/17/2018 2143   HGBUR NEGATIVE 01/17/2018 2143   BILIRUBINUR NEGATIVE 01/17/2018 2143   KETONESUR NEGATIVE 01/17/2018 2143   PROTEINUR NEGATIVE 01/17/2018 2143   NITRITE NEGATIVE 01/17/2018 2143   LEUKOCYTESUR NEGATIVE 01/17/2018 2143    Radiological Exams on Admission: CT Head Wo Contrast  Result Date: 05/19/2021 CLINICAL DATA:  Drinking continuously since July, intoxicated, suicidal ideations, found on floor in hallway, unwitnessed fall, slurred speech EXAM: CT HEAD  WITHOUT CONTRAST TECHNIQUE: Contiguous axial images were obtained from the base of the skull through the vertex without intravenous contrast. Sagittal and coronal MPR images reconstructed from axial data set. COMPARISON:  01/18/2018 FINDINGS: Brain: Normal ventricular morphology. No midline shift or mass effect. Normal appearance of brain parenchyma. No intracranial hemorrhage, mass lesion, or evidence of acute infarction. No extra-axial fluid collections. Vascular: No hyperdense vessels Skull: Intact Sinuses/Orbits: Clear Other: N/A IMPRESSION: Normal exam. Electronically Signed   By: Ulyses Southward M.D.   On: 05/19/2021 16:09   CT Cervical Spine Wo Contrast  Result Date: 05/19/2021 CLINICAL DATA:  Larey Seat, laceration under chin EXAM: CT CERVICAL SPINE WITHOUT CONTRAST TECHNIQUE: Multidetector CT imaging of the cervical spine was performed without intravenous contrast. Multiplanar CT image reconstructions were also generated. COMPARISON:  01/17/2018 FINDINGS: Alignment: Alignment is grossly anatomic. Skull base and vertebrae: No acute fracture. No primary bone lesion or focal pathologic process. Incidental cervical ribs at C7 again noted. Soft tissues and spinal canal: No prevertebral fluid or swelling. No  visible canal hematoma. Disc levels: Stable C4-5 ACDF. Nonsurgical disc spaces are relatively well preserved. Upper chest: Unremarkable. Other: Reconstructed images demonstrate no additional findings. IMPRESSION: 1. No acute cervical spine fracture. Electronically Signed   By: Sharlet Salina M.D.   On: 05/19/2021 17:07   CT Maxillofacial Wo Contrast  Result Date: 05/19/2021 CLINICAL DATA:  Jaw pain after fall EXAM: CT MAXILLOFACIAL WITHOUT CONTRAST TECHNIQUE: Multidetector CT imaging of the maxillofacial structures was performed. Multiplanar CT image reconstructions were also generated. COMPARISON:  None. FINDINGS: Osseous: No fracture or mandibular dislocation. No destructive process. Orbits: Negative. No  traumatic or inflammatory finding. Sinuses: Clear. Soft tissues: Negative. Limited intracranial: No significant or unexpected finding. IMPRESSION: 1. No acute facial bone fracture. Unremarkable appearance of the mandible. Electronically Signed   By: Sharlet Salina M.D.   On: 05/19/2021 16:03      Assessment/Plan   Ryan Stark is a 35 y.o. male with medical history significant for chronic alcohol abuse, alcohol withdrawal seizures, chronic tobacco abuse, who is admitted to [facility] on 05/19/2021 by way of transfer from [facility] ED for further evaluation and management of seizures, with which he presented from home to the latter facility.    Principal Problem:   Seizure (HCC) Active Problems:   Chronic alcohol use   Suicidal ideation   Tobacco abuse     #) tonic-clonic seizure: Potential tonic-clonic activity witnessed by patient's girlfriend at 110 822 and lasting for or less before spontaneous resolution in the absence of any interval pharmacologic intervention, with reported ensuing period of diminished responsiveness, of the duration of this potential postictal state is unclear.  Subsequently, the patient has been baseline mental status, without any evidence of ensuing seizure-like activity.  In the context of chronic alcohol abuse, with a brief period of abstinence from consuming alcohol, as noted by presenting serum ethanol level less than 10, suspected seizure is felt to be most consistent with alcohol withdrawal, including per ensuing discussions with on-call neurology, as further described above.  This is in the context of a history of 1 prior seizure approximately a year ago it was felt to be on the basis of alcohol withdrawal, without any history of additional seizures to the above episode occurring 05/19/21. Of note, noncontrast CT showed no evidence of acute intracranial process, including no evidence of intracranial hemorrhage or acute ischemic infarct.  While  the differential appears to favor alcohol withdrawal as underlying etiology, given that this is a second reported seizure experienced by the patient, without any evidence of previous MRI of the brain, per chart review, we will pursue MRI of the brain to evaluate for any additional organic contributions.     Of note, the patient does not appear to have a history of diabetes, initial laboratory evidence reveals no findings suggestive of hypoglycemia.  No history of stimulant recreational drug use, which is supported by results of today's urinary drug screen.   Not on any medications as an outpatient to lower seizure threshold.  No evidence of hyponatremia.  Add on serum magnesium level.  No reported preceding or recent trauma, as it appears, per history, that the patient fell as a consequence of a seizure as opposed to seizing as a result of a fall, as he does not recall the fall itself.  Baseline renal function.  Will check MRI of the brain to further evaluate for any underlying structural contributions as well as to evaluate for stroke, as further detailed above.  The  patient received Keppra loading dose of 1000 g IV x1 in the ED today.  As noted above, case was discussed with on-call neurology, agreeing with the above plan, and suspicious of alcohol withdrawal at this is likely underlying source of potential presenting seizure, without associated recommendations for scheduling additional antiepileptics or recommendations for EEG monitoring, while conveying that they are available for additional recommendations should the patient subsequently exhibit any additional evidence of seizure-like activity.  No evidence to suggest status epilepticus at this time.      Plan: Seizure precautions. Prn IV Ativan for breakthrough seizure.  Monitor on telemetry and continuous pulse-ox. CMP in the morning. Repeat CBC in the morning. Add-on serum Mg level.  Check MRI of the brain, as above.  Serial neuro checks been ordered  in the form of q. 4 hours x 5 occurrences.    Every 4 hours Accu-Cheks x5 occurrences to evaluate for any evidence of contributory hypoglycemia.  Further evaluation and management of increases for additional alcohol withdrawal, as further detailed below.  Check prolactin level.      #) Chronic alcohol abuse: As further detailed above, with reported most recent alcohol consumption on 05/18/2021, which appears to be commiserated with nonelevated serum ethanol level obtained at [facility] ED. ensuing relative alcohol withdrawal suspected to be the basis for potential presenting seizure.  At this time, no evidence of active withdrawal, and no additional evidence to suggest underlying seizure activity at this time.  Of note, the patient received a dose of oral thiamine prior to initiation of a diet at [facility].  He is also received oral folic acid as well as multivitamin.   Plan: CIWA protocol initiated, as above.  Continue daily thiamine, folic acid, and multivitamin supplementation.  Monitor on telemetry.  Consult placed to the transition of care team.  Add on serum magnesium level and check serum phosphorus.  INR.  CMP in the morning.  Constipation importance of reduction in alcohol consumption.      #) Suicidal ideations: Patient reportedly conveyed suicidal ideations to staff at [facility] earlier today.  Consequently, consult has been placed to psychiatry, and one-to-one sitter has been ordered.  Plan: Psychiatry consult, as above.  One-to-one, as above.       #) Chronic tobacco abuse: Patient Mariann Laster is a current smoker, expect half pack to 1 pack/day for at least the last 10 years.  Plan: Counseled the patient on the importance of complete smoking discontinuation.     DVT prophylaxis: SCDs Code Status: Full code Disposition Plan: Per Rounding Team Consults called: [facility] EDP discussed the patient's case with the on-call neurologist, as  further detailed above;   Admission status: Inpatient; med telemetry   Of note, this patient was added by me to the following Admit List/Treatment Team:  mcadmits.   Of note, the Adult Admission Order Set (Multimorbid order set) was used by me in the admission process for this patient.  PLEASE NOTE THAT DRAGON DICTATION SOFTWARE WAS USED IN THE CONSTRUCTION OF THIS NOTE.   Angie Fava DO [facility] Pager [phone] From 7PM - 7AM   05/19/2021, 11:15 PM

## 2021-05-19 NOTE — ED Triage Notes (Addendum)
Pt brought in by ex-girlfriend who states she has been taking care of him. Reports recently under invol commitment at [facility]. States pt has been drinking continuously since ned of July. Has voiced SI. States he bought rope a few days ago and she threw it away. Pt intoxicated presently. Reports she heard a thump and found him on the floor in the hallway bleeding. Laceration present to underside of chin. Bandage not removed in triage. Pt awake, speech slurred, following commands. Visitor states they went to Urgent Care first and were advised to come to ED for eval for possible broken jaw. States pt may have had a seizure. Admits to etoh today, Denies SI/HI at present but states he has had SI within the last week

## 2021-05-19 NOTE — ED Notes (Signed)
Seizure pads were placed.

## 2021-05-19 NOTE — ED Notes (Signed)
Report given to Samana at 5W.  [facility] en route.
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
## 2021-05-19 NOTE — Plan of Care (Signed)
  Problem: Education: Goal: Knowledge of General Education information will improve Description: Including pain rating scale, medication(s)/side effects and non-pharmacologic comfort measures Outcome: Progressing   Problem: Activity: Goal: Risk for activity intolerance will decrease Outcome: Progressing   

## 2021-05-19 NOTE — ED Notes (Signed)
No sitter available. Pt in room with door open to the nurse's station. Pt changed into psych scrubs and wanded. Waiting to move pt into ED [facility] room. Family at bedside. Pt calm and cooperative with no SI thoughts at present

## 2021-05-19 NOTE — ED Notes (Signed)
Due to possibility of client having SI, door is left open, pt placed in disposable scrubs, security to bedside to wand client

## 2021-05-20 ENCOUNTER — Inpatient Hospital Stay (HOSPITAL_COMMUNITY): Payer: Self-pay

## 2021-05-20 DIAGNOSIS — Z720 Tobacco use: Secondary | ICD-10-CM | POA: Diagnosis present

## 2021-05-20 DIAGNOSIS — R45851 Suicidal ideations: Secondary | ICD-10-CM

## 2021-05-20 DIAGNOSIS — F1090 Alcohol use, unspecified, uncomplicated: Secondary | ICD-10-CM | POA: Diagnosis present

## 2021-05-20 LAB — CBC
HCT: 42.2 % (ref 39.0–52.0)
Hemoglobin: 14.4 g/dL (ref 13.0–17.0)
MCH: 31.1 pg (ref 26.0–34.0)
MCHC: 34.1 g/dL (ref 30.0–36.0)
MCV: 91.1 fL (ref 80.0–100.0)
Platelets: 170 10*3/uL (ref 150–400)
RBC: 4.63 MIL/uL (ref 4.22–5.81)
RDW: 14 % (ref 11.5–15.5)
WBC: 6.9 10*3/uL (ref 4.0–10.5)
nRBC: 0 % (ref 0.0–0.2)

## 2021-05-20 LAB — GLUCOSE, CAPILLARY
Glucose-Capillary: 81 mg/dL (ref 70–99)
Glucose-Capillary: 76 mg/dL (ref 70–99)
Glucose-Capillary: 84 mg/dL (ref 70–99)
Glucose-Capillary: 75 mg/dL (ref 70–99)
Glucose-Capillary: 109 mg/dL — ABNORMAL HIGH (ref 70–99)
Glucose-Capillary: 86 mg/dL (ref 70–99)

## 2021-05-20 LAB — COMPREHENSIVE METABOLIC PANEL
ALT: 17 U/L (ref 0–44)
AST: 24 U/L (ref 15–41)
Albumin: 3.6 g/dL (ref 3.5–5.0)
Alkaline Phosphatase: 65 U/L (ref 38–126)
Anion gap: 11 (ref 5–15)
BUN: 7 mg/dL (ref 6–20)
CO2: 23 mmol/L (ref 22–32)
Calcium: 9.4 mg/dL (ref 8.9–10.3)
Chloride: 105 mmol/L (ref 98–111)
Creatinine, Ser: 1.15 mg/dL (ref 0.61–1.24)
GFR, Estimated: >60 mL/min (ref >60)
Glucose, Bld: 74 mg/dL (ref 70–99)
Potassium: 3.8 mmol/L (ref 3.5–5.1)
Sodium: 139 mmol/L (ref 135–145)
Total Bilirubin: 1.1 mg/dL (ref 0.3–1.2)
Total Protein: 6.3 g/dL — ABNORMAL LOW (ref 6.5–8.1)

## 2021-05-20 LAB — PROTIME-INR
INR: 1.1 (ref 0.8–1.2)
Prothrombin Time: 14.5 seconds (ref 11.4–15.2)

## 2021-05-20 LAB — MAGNESIUM
Magnesium: 2.3 mg/dL (ref 1.7–2.4)
Magnesium: 2.4 mg/dL (ref 1.7–2.4)

## 2021-05-20 LAB — HIV ANTIBODY (ROUTINE TESTING W REFLEX): HIV Screen 4th Generation wRfx: NONREACTIVE

## 2021-05-20 LAB — PHOSPHORUS: Phosphorus: 3.6 mg/dL (ref 2.5–4.6)

## 2021-05-20 LAB — MRSA NEXT GEN BY PCR, NASAL: MRSA by PCR Next Gen: NOT DETECTED

## 2021-05-20 IMAGING — MR MR HEAD W/O CM
7 of 11 series · 16 of 48 positions shown · non-contrast
Comparison: Head CT [DATE].

CLINICAL DATA: 35-year-old male with refractory seizure. Severe
alcohol use. Reportedly having hallucinations prior to the seizure
activity.

EXAM:
MRI HEAD WITHOUT CONTRAST
TECHNIQUE: Multiplanar, multiecho pulse sequences of the brain and surrounding
structures were obtained without intravenous contrast.

[Series 2: DWI · axial · 3.0mm · 0.94mm/px · z∈[-119,+28]mm · 5 of 108 slices shown]
[im 1/108]
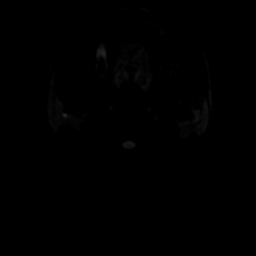
[im 27/108]
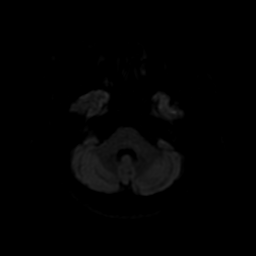
[im 54/108]
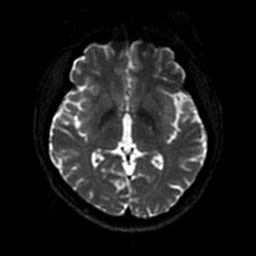
[im 81/108]
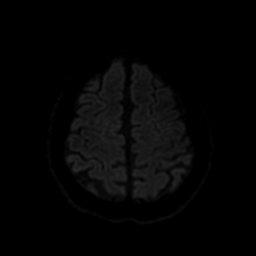
[im 108/108]
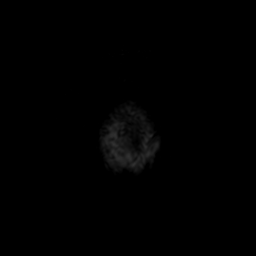

[Series 3: FLAIR · sagittal · 5.0mm · 0.23mm/px · 1 of 27 slices shown (1 of 3)]
[im 1/27]
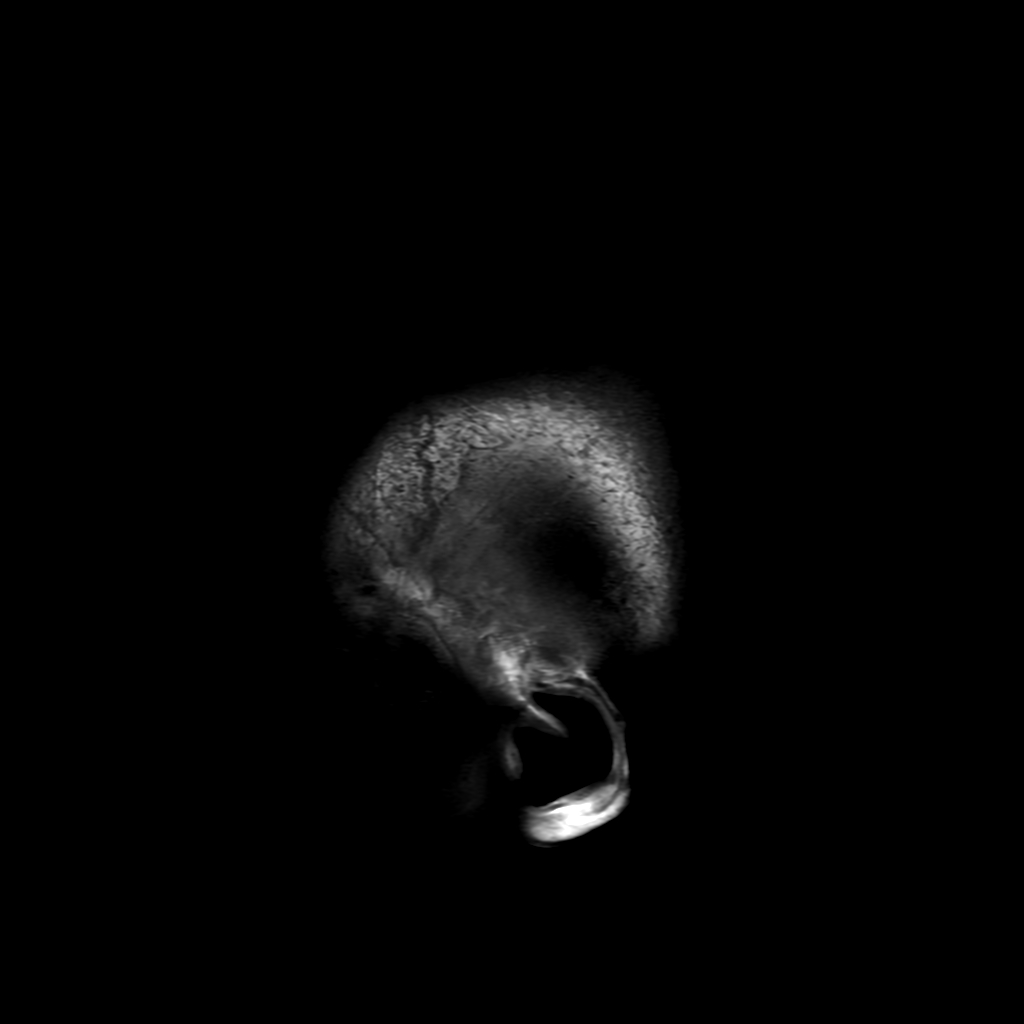

[Series 4: T2 · axial · 5.0mm · 0.23mm/px · z∈[-119,+28]mm · 2 of 28 slices shown (1 of 2)]
[im 1/28]
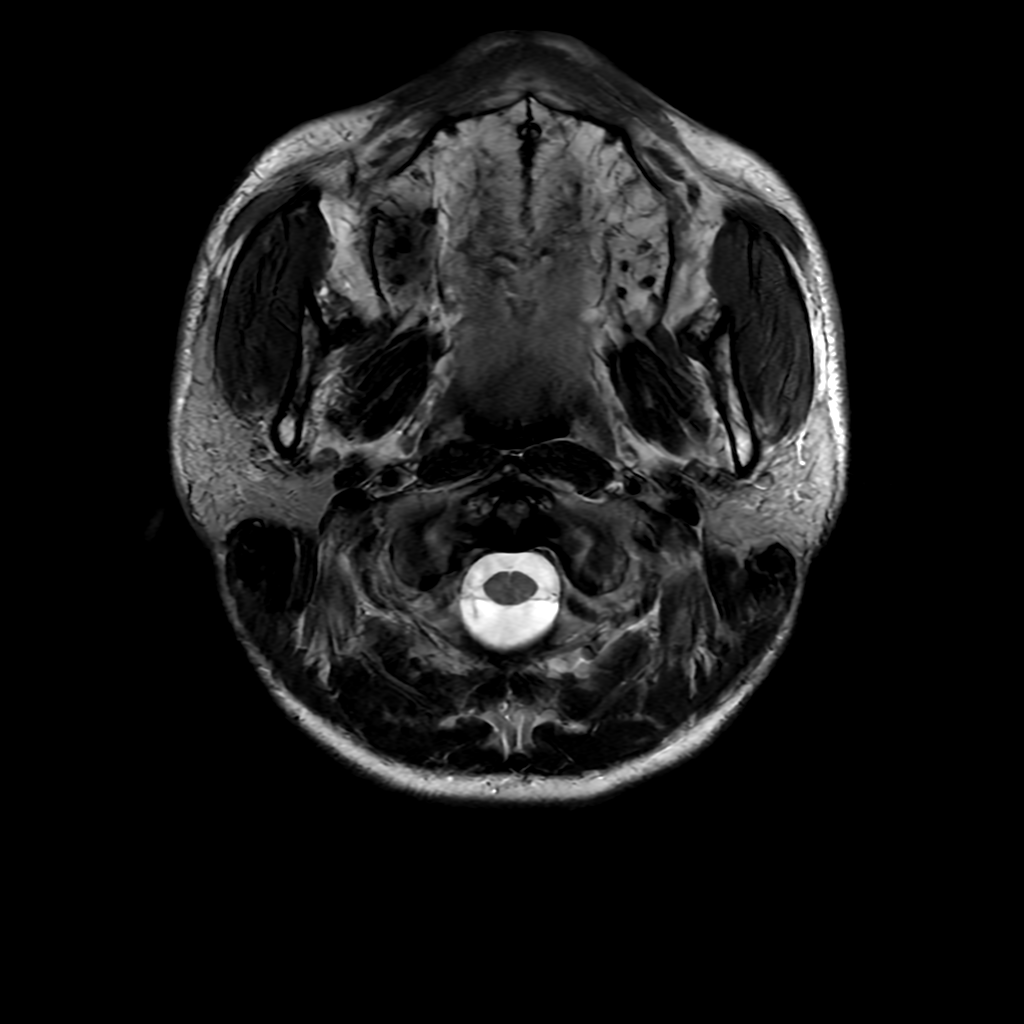
[im 28/28]
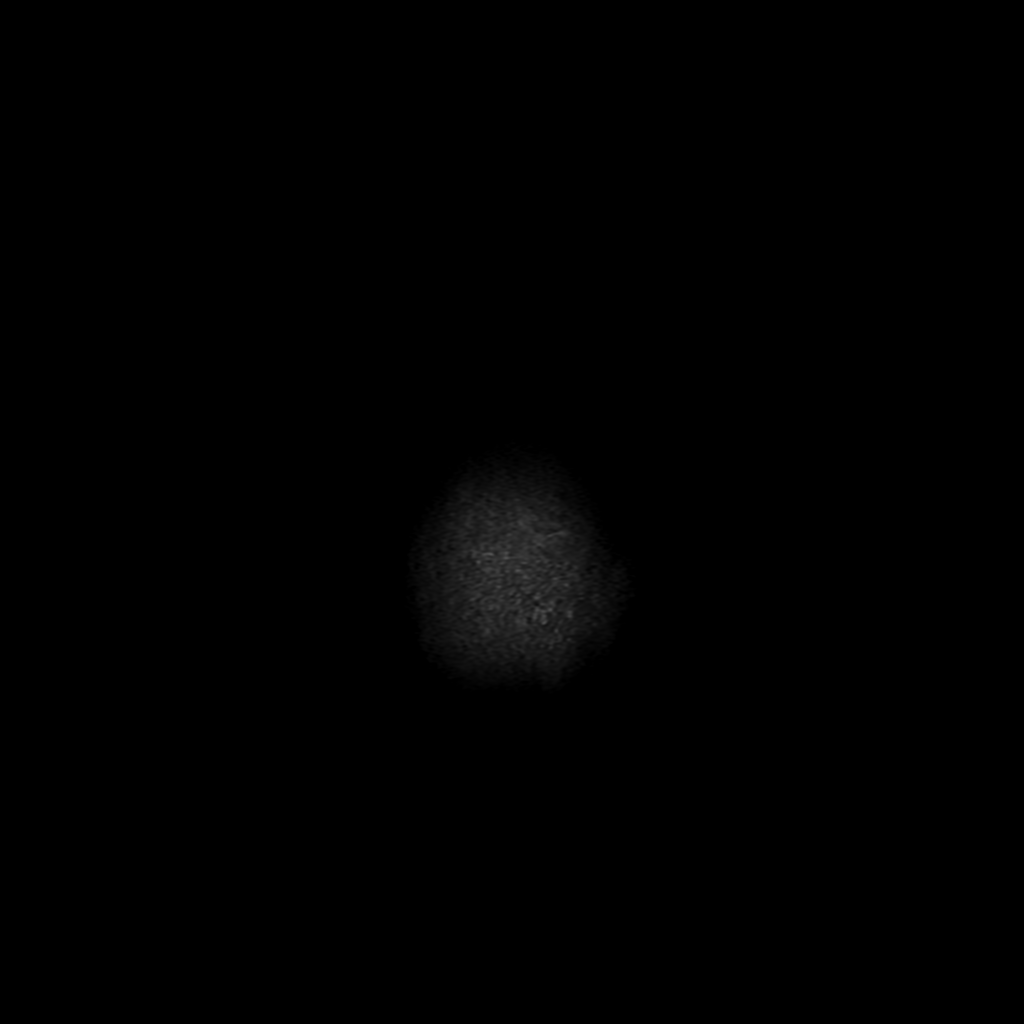

[Series 5: FLAIR · axial · 4.0mm · 0.45mm/px · z∈[-117,+31]mm · 2 of 38 slices shown (2 of 3)]
[im 1/38]
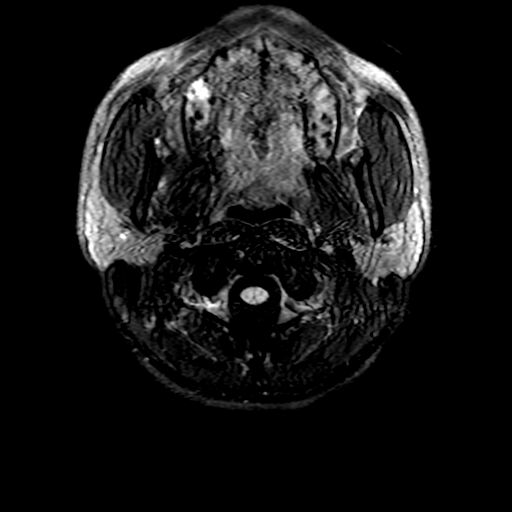
[im 38/38]
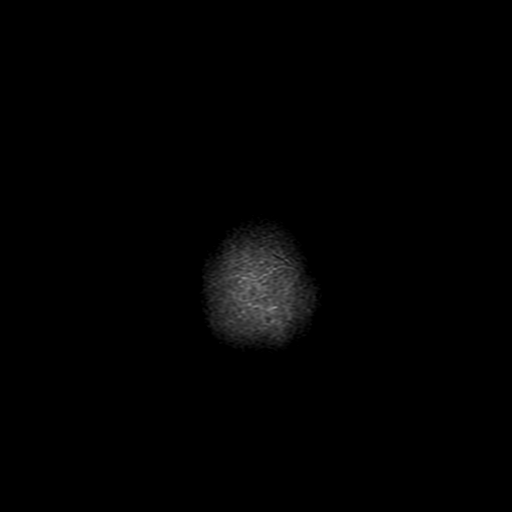

[Series 8: T2 · oblique · 3.0mm · 0.35mm/px · 1 of 35 slices shown (2 of 2)]
[im 1/35]
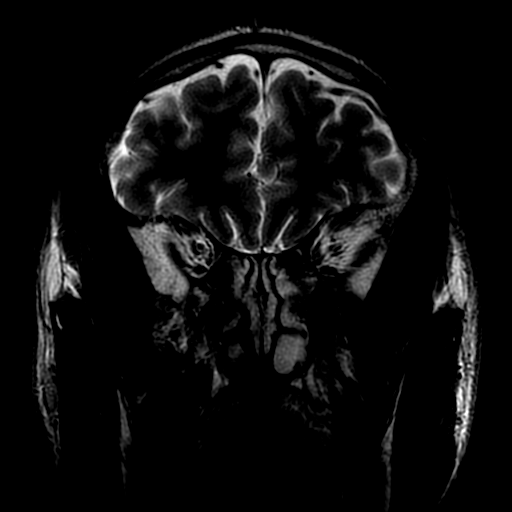

[Series 9: FLAIR · oblique · 3.0mm · 0.39mm/px · 2 of 35 slices shown (3 of 3)]
[im 1/35]
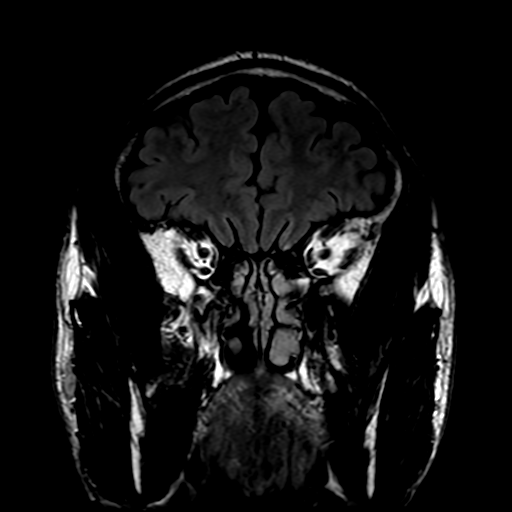
[im 35/35]
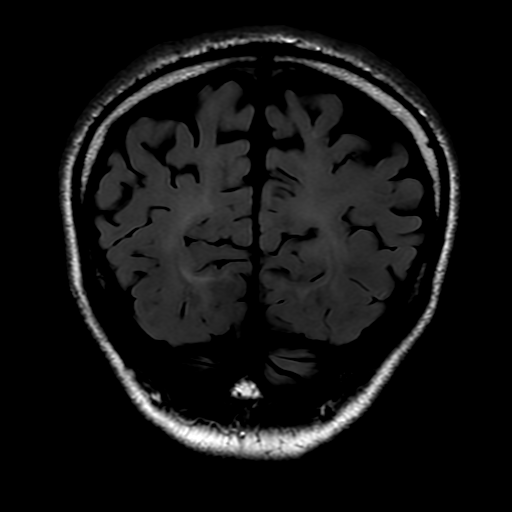

[Series 250: ADC · axial · 3.0mm · 0.94mm/px · z∈[-119,+28]mm · 3 of 54 slices shown]
[im 1/54]
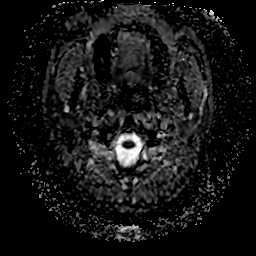
[im 27/54]
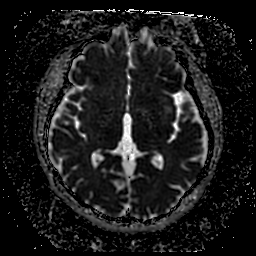
[im 54/54]
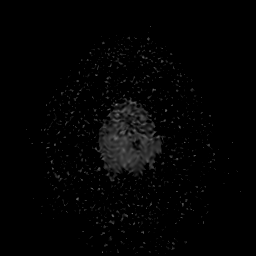

[16 of 48 positions shown; findings below may reference images not displayed]

FINDINGS: Brain: Generalized cerebral volume loss for age. No restricted
diffusion to suggest acute infarction. No midline shift, mass
effect, evidence of mass lesion, ventriculomegaly, extra-axial
collection or acute intracranial hemorrhage. Cervicomedullary
junction and pituitary are within normal limits.

Thin slice coronal temporal lobe imaging. Hippocampal formations
appear symmetric. No abnormal signal or focal volume loss. Other
mesial temporal lobe structures likewise appears symmetric.

Overall cerebral morphology is within normal limits, no migrational
abnormality. No encephalomalacia or chronic cerebral blood products
identified. Gray and white matter signal within normal limits.

Vascular: Major intracranial vascular flow voids are preserved. The
left vertebral artery appears to be dominant.

Skull and upper cervical spine: Negative.

Sinuses/Orbits: Rightward gaze, but otherwise negative orbits. Mild
to moderate paranasal sinus mucosal thickening, mostly the ethmoids.
No sinus fluid levels.

Other: Mastoids are clear. Visible internal auditory structures
appear normal. Negative visible scalp and face.
IMPRESSION: 1. Age advanced generalized cerebral volume loss but no acute
intracranial abnormality, and otherwise negative noncontrast MRI
appearance of the brain.

2. Mild paranasal sinus inflammation.

## 2021-05-20 MED ORDER — HYDROCODONE-ACETAMINOPHEN 5-325 MG PO TABS
1.0000 | ORAL_TABLET | Freq: Four times a day (QID) | ORAL | Status: DC | PRN
Start: 2021-05-20 — End: 2021-05-22
  Administered 2021-05-20 – 2021-05-21 (×2): 1 via ORAL
  Filled 2021-05-20 (×2): qty 1

## 2021-05-20 MED ORDER — LACTATED RINGERS IV SOLN: INTRAVENOUS | Status: DC

## 2021-05-20 MED ORDER — ENSURE ENLIVE PO LIQD
237.0000 mL | Freq: Two times a day (BID) | ORAL | Status: DC
  Administered 2021-05-20 – 2021-05-22 (×3): 237 mL via ORAL

## 2021-05-20 NOTE — Assessment & Plan Note (Addendum)
-  suspected seizure is felt to be most consistent with alcohol withdrawal -ER discussed with neurology-- loaded with keppra but no need to continue -MRI shows generalized atrophy -TSH, b12 normal

## 2021-05-20 NOTE — Progress Notes (Signed)
  Progress Note    Ryan Stark   TXM:[id number]  DOB: 08-11-1986  DOA: 05/19/2021     1 Date of Service: 05/20/2021    Ryan Stark is a 35 y.o. male with medical history significant for chronic alcohol abuse, alcohol withdrawal seizures, chronic tobacco abuse, who is admitted to [facility] on 05/19/2021 by way of transfer from [facility] ED for further evaluation and management of seizures  Subjective:  No complaints, denies SI   Hospital Problems * Seizure [facility]) -suspected seizure is felt to be most consistent with alcohol withdrawal -ER discussed with neurology-- loaded with keppra but no need to continue -MRI shows generalized atrophy   Tobacco abuse -encourage cessation  Suicidal ideation -currently denies any plan/SI -psych has already been consulted -has sitter currently- defer to psych to d/c  Chronic alcohol use -CIWA protocol -thiamine -TOC consult    Objective Vital signs were reviewed and unremarkable.  Vitals:   05/20/21 0702 05/20/21 0800 05/20/21 1130 05/20/21 1201  BP:  109/76  94/60  Pulse:  76 82 78  Resp: 14   16  Temp: 98.4 F (36.9 C)   98.4 F (36.9 C)  TempSrc: Oral   Oral  SpO2:  96%  95%  Weight:      Height:       60.3 kg  Exam  General: Appearance:    Thin male in no acute distress     Lungs:     respirations unlabored  Heart:    Normal heart rate.    MS:   All extremities are intact.    Neurologic:   Awake, alert, oriented x 3     Labs / Other Information Labs unremarkable    Marlin Canary DO  [facility] 05/20/2021, 12:47 PM

## 2021-05-20 NOTE — Progress Notes (Signed)
Pt denies any suicidal ideation or any plan to commit suicide at this time. Encouraged pt to reach out to staff/sitter at any point if he has thoughts of harming himself or others.  Pt verbalized understanding.  Suicide sitter at pt's bedside.

## 2021-05-20 NOTE — Assessment & Plan Note (Addendum)
-  CIWA protocol -thiamine -no sign of withdrawal -TOC consult for placement

## 2021-05-20 NOTE — Assessment & Plan Note (Addendum)
-  currently denies any plan/SI -? psychosis -psych has already been consulted-- plan for IVC -has sitter - TSH/B12/prolactin normal -start Risperdal 1mg  QHS
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
## 2021-05-20 NOTE — Plan of Care (Signed)
  Problem: Education: Goal: Knowledge of General Education information will improve Description Including pain rating scale, medication(s)/side effects and non-pharmacologic comfort measures Outcome: Progressing   Problem: Health Behavior/Discharge Planning: Goal: Ability to manage health-related needs will improve Outcome: Progressing   

## 2021-05-20 NOTE — Progress Notes (Addendum)
Pt states feeling dizzy.Rn notified Will recheck BP in 5-10 min's

## 2021-05-20 NOTE — Plan of Care (Signed)
  Problem: Education: Goal: Knowledge of General Education information will improve Description: Including pain rating scale, medication(s)/side effects and non-pharmacologic comfort measures Outcome: Progressing   Problem: Nutrition: Goal: Adequate nutrition will be maintained Outcome: Progressing   Problem: Safety: Goal: Ability to remain free from injury will improve Outcome: Progressing   Problem: Coping: Goal: Coping ability will improve Outcome: Progressing

## 2021-05-20 NOTE — Assessment & Plan Note (Signed)
encourage cessation 

## 2021-05-20 NOTE — Progress Notes (Signed)
Chaplain received a page from [employer] that patient requested a Bible.  Chaplain visited with the patient who was very appreciative, lights were off and sitter present, patient did not want to read right now.  Chaplain offered a moment of support.  Chaplain Agustin Cree, [place].    05/20/21 2301  Clinical Encounter Type  Visited With Patient;Health care provider  Visit Type Initial;Spiritual support  Referral From Patient;Nurse  Consult/Referral To Chaplain  Spiritual Encounters  Spiritual Needs Literature

## 2021-05-21 DIAGNOSIS — F2081 Schizophreniform disorder: Secondary | ICD-10-CM

## 2021-05-21 DIAGNOSIS — R569 Unspecified convulsions: Secondary | ICD-10-CM

## 2021-05-21 DIAGNOSIS — R45851 Suicidal ideations: Secondary | ICD-10-CM

## 2021-05-21 LAB — PROLACTIN: Prolactin: 36.7 ng/mL — ABNORMAL HIGH (ref 4.0–15.2)

## 2021-05-21 LAB — GLUCOSE, CAPILLARY
Glucose-Capillary: 101 mg/dL — ABNORMAL HIGH (ref 70–99)
Glucose-Capillary: 118 mg/dL — ABNORMAL HIGH (ref 70–99)
Glucose-Capillary: 82 mg/dL (ref 70–99)
Glucose-Capillary: 87 mg/dL (ref 70–99)
Glucose-Capillary: 90 mg/dL (ref 70–99)
Glucose-Capillary: 99 mg/dL (ref 70–99)

## 2021-05-21 LAB — TSH: TSH: 1.348 u[IU]/mL (ref 0.350–4.500)

## 2021-05-21 LAB — VITAMIN B12: Vitamin B-12: 776 pg/mL (ref 180–914)

## 2021-05-21 MED ORDER — RISPERIDONE 0.5 MG PO TABS: 1.0000 mg | ORAL_TABLET | Freq: Every day | ORAL | Status: DC

## 2021-05-21 MED ORDER — NICOTINE 14 MG/24HR TD PT24
14.0000 mg | MEDICATED_PATCH | Freq: Every day | TRANSDERMAL | Status: DC
  Administered 2021-05-21 – 2021-05-22 (×2): 14 mg via TRANSDERMAL
  Filled 2021-05-21 (×2): qty 1

## 2021-05-21 NOTE — Progress Notes (Signed)
  Progress Note    Ryan Stark   DIY:[id number]  DOB: 16-Dec-1985  DOA: 05/19/2021     2 Date of Service: 05/21/2021    Ryan Stark is a 35 y.o. male with medical history significant for chronic alcohol abuse, alcohol withdrawal seizures, chronic tobacco abuse, who is admitted to [facility] on 05/19/2021 by way of transfer from [facility] ED for further evaluation and management of seizures.  Is being IVC'd for psychosis.  Subjective:  No SOB, no CP  Hospital Problems * Seizure [facility]) -suspected seizure is felt to be most consistent with alcohol withdrawal -ER discussed with neurology-- loaded with keppra but no need to continue -MRI shows generalized atrophy   Tobacco abuse -encourage cessation  Suicidal ideation -currently denies any plan/SI -? psychosis -psych has already been consulted-- plan for IVC -has sitter -check TSH/B12/prolactin -start Risperdal 1mg  QHS if prolactin normal  Chronic alcohol use -CIWA protocol -thiamine -no sign of withdrawal -TOC consult    Objective Vital signs were reviewed and unremarkable.  Vitals:   05/21/21 0334 05/21/21 0335 05/21/21 0708 05/21/21 1300  BP:  114/68 99/61 114/79  Pulse:  74 76   Resp:  16 18   Temp:  97.8 F (36.6 C) 98.7 F (37.1 C) 97.9 F (36.6 C)  TempSrc:  Oral Tympanic   SpO2:  99% 95%   Weight: 65.9 kg 65.9 kg    Height:       65.9 kg  Exam  General: Appearance:    Thin male in no acute distress     Lungs:     respirations unlabored  Heart:    Normal heart rate.    MS:   All extremities are intact.    Neurologic:   Awake, alert     Labs / Other Information TSH/B12 pending Prolactin pending as prior was elevated    07/21/21 DO [facility] 05/21/2021, 1:42 PM
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
## 2021-05-21 NOTE — Progress Notes (Signed)
Initial Nutrition Assessment  DOCUMENTATION CODES:   Not applicable  INTERVENTION:   -Ensure Enlive po BID, each supplement provides 350 kcal and 20 grams of protein  -MVI with minerals daily  NUTRITION DIAGNOSIS:   Inadequate oral intake related to decreased appetite as evidenced by per patient/family report.  GOAL:   Patient will meet greater than or equal to 90% of their needs  MONITOR:   PO intake, Supplement acceptance, Labs, Weight trends, Skin, I & O's  REASON FOR ASSESSMENT:   Malnutrition Screening Tool    ASSESSMENT:   Ryan Stark is a 35 y.o. male with medical history significant for chronic alcohol abuse, alcohol withdrawal seizures, chronic tobacco abuse, who is admitted to [facility] on 05/19/2021 by way of transfer from [facility] ED for further evaluation and management of seizures, with which he presented from home to the latter facility.  Pt admitted with tonic-clonic seizure.   Reviewed I/O's: +457 ml x 24 hours and -143 ml since admission  UOP: 500 ml x 24 hours  Spoke with pt at bedside, who reports feeling unwell for one month PTA. He has had progressive decreased oral intake over the past month. Pt explains that he typically consumes 2 meals per day of traditional African food that she makes himself. Per H&P, pt also consumed 12 beer per day. However, pt reports not eating for 3 days PTA. Pt has been consuming meals here- noted meal completions 50-100% (pt just ordered lunch of a salad prior to RD visit). Noted pt consumed about 75% of an Ensure supplement.   Per pt, his UBW is around 155#. He estimates he has lost 20# within the past month, however, no wt hx available to assess at this time.   RD discussed importance of good meal and supplement intake to promote healing.  Medications reviewed and include folic acid, ativan, and thiamine.   Labs reviewed: CBGS: 75-109 (inpatient orders for glycemic control are none).     NUTRITION - FOCUSED PHYSICAL EXAM:  Flowsheet Row Most Recent Value  Orbital Region No depletion  Upper Arm Region No depletion  Thoracic and Lumbar Region No depletion  Buccal Region No depletion  Temple Region No depletion  Clavicle Bone Region No depletion  Clavicle and Acromion Bone Region No depletion  Scapular Bone Region No depletion  Dorsal Hand No depletion  Patellar Region No depletion  Anterior Thigh Region No depletion  Posterior Calf Region No depletion  Edema (RD Assessment) None  Hair Reviewed  Eyes Reviewed  Mouth Reviewed  Skin Reviewed  Nails Reviewed       Diet Order:   Diet Order             Diet regular Room service appropriate? Yes; Fluid consistency: Thin  Diet effective now                   EDUCATION NEEDS:   Education needs have been addressed  Skin:  Skin Assessment: Reviewed RN Assessment  Last BM:  05/19/21  Height:   Ht Readings from Last 1 Encounters:  05/20/21 6' (1.829 m)    Weight:   Wt Readings from Last 1 Encounters:  05/21/21 65.9 kg    Ideal Body Weight:  80.9 kg  BMI:  Body mass index is 19.7 kg/m.  Estimated Nutritional Needs:   Kcal:  1800-2000  Protein:  90-105 grams  Fluid:  > 1.8 L    Levada Schilling, RD, LDN, CDCES Registered Dietitian II  Certified Diabetes Care and Education Specialist Please refer to [facility] for RD and/or RD on-call/weekend/after hours pager

## 2021-05-21 NOTE — Progress Notes (Addendum)
CSW received consult from MD to complete IVC paperwork. CSW completed paperwork and faxed to Magistrate.   Magistrate confirmed receipt; [employer] contacted to serve patient. Paperwork on hard chart.   Joaquin Courts, MSW, [facility]

## 2021-05-21 NOTE — Progress Notes (Signed)
Patient states he has called his father and would like for the Dr to come up and talk with his father once he gets to the hospital. Patient isn't sure when his father will be coming but he knows where he is.

## 2021-05-22 ENCOUNTER — Encounter (HOSPITAL_COMMUNITY): Payer: Self-pay | Admitting: Psychiatry

## 2021-05-22 ENCOUNTER — Inpatient Hospital Stay (HOSPITAL_COMMUNITY)
Admission: AD | Admit: 2021-05-22 | Discharge: 2021-05-28 | DRG: 885 | Disposition: A | Discharge status: Home / Self Care | Payer: Federal, State, Local not specified - Other | Source: Intra-hospital | Attending: Psychiatry | Admitting: Psychiatry

## 2021-05-22 ENCOUNTER — Other Ambulatory Visit: Payer: Self-pay

## 2021-05-22 DIAGNOSIS — R569 Unspecified convulsions: Secondary | ICD-10-CM | POA: Diagnosis present

## 2021-05-22 DIAGNOSIS — F419 Anxiety disorder, unspecified: Secondary | ICD-10-CM | POA: Diagnosis present

## 2021-05-22 DIAGNOSIS — Z20822 Contact with and (suspected) exposure to covid-19: Secondary | ICD-10-CM | POA: Diagnosis present

## 2021-05-22 DIAGNOSIS — Z91199 Patient's noncompliance with other medical treatment and regimen due to unspecified reason: Secondary | ICD-10-CM | POA: Diagnosis not present

## 2021-05-22 DIAGNOSIS — F1010 Alcohol abuse, uncomplicated: Secondary | ICD-10-CM | POA: Diagnosis not present

## 2021-05-22 DIAGNOSIS — K5900 Constipation, unspecified: Secondary | ICD-10-CM | POA: Diagnosis present

## 2021-05-22 DIAGNOSIS — F1994 Other psychoactive substance use, unspecified with psychoactive substance-induced mood disorder: Secondary | ICD-10-CM | POA: Diagnosis present

## 2021-05-22 DIAGNOSIS — K30 Functional dyspepsia: Secondary | ICD-10-CM | POA: Diagnosis present

## 2021-05-22 DIAGNOSIS — F10239 Alcohol dependence with withdrawal, unspecified: Secondary | ICD-10-CM | POA: Diagnosis present

## 2021-05-22 DIAGNOSIS — R45851 Suicidal ideations: Secondary | ICD-10-CM | POA: Diagnosis present

## 2021-05-22 DIAGNOSIS — F1090 Alcohol use, unspecified, uncomplicated: Secondary | ICD-10-CM | POA: Diagnosis present

## 2021-05-22 DIAGNOSIS — F17210 Nicotine dependence, cigarettes, uncomplicated: Secondary | ICD-10-CM | POA: Diagnosis present

## 2021-05-22 DIAGNOSIS — F29 Unspecified psychosis not due to a substance or known physiological condition: Principal | ICD-10-CM

## 2021-05-22 DIAGNOSIS — G4700 Insomnia, unspecified: Secondary | ICD-10-CM | POA: Diagnosis present

## 2021-05-22 LAB — RESP PANEL BY RT-PCR (FLU A&B, COVID) ARPGX2
Influenza A by PCR: NEGATIVE
Influenza B by PCR: NEGATIVE
SARS Coronavirus 2 by RT PCR: NEGATIVE

## 2021-05-22 LAB — LIPID PANEL
Cholesterol: 171 mg/dL (ref 0–200)
HDL: 53 mg/dL (ref >40)
LDL Cholesterol: 102 mg/dL — ABNORMAL HIGH (ref 0–99)
Total CHOL/HDL Ratio: 3.2 RATIO
Triglycerides: 81 mg/dL (ref <150)
VLDL: 16 mg/dL (ref 0–40)

## 2021-05-22 LAB — HEMOGLOBIN A1C
Hgb A1c MFr Bld: 5.6 % (ref 4.8–5.6)
Mean Plasma Glucose: 114.02 mg/dL

## 2021-05-22 LAB — PROLACTIN: Prolactin: 9.3 ng/mL (ref 4.0–15.2)

## 2021-05-22 LAB — GLUCOSE, CAPILLARY
Glucose-Capillary: 101 mg/dL — ABNORMAL HIGH (ref 70–99)
Glucose-Capillary: 102 mg/dL — ABNORMAL HIGH (ref 70–99)

## 2021-05-22 MED ORDER — ENSURE ENLIVE PO LIQD: 237.0000 mL | Freq: Two times a day (BID) | ORAL | 12 refills | Status: DC

## 2021-05-22 MED ORDER — NICOTINE 14 MG/24HR TD PT24
14.0000 mg | MEDICATED_PATCH | Freq: Every day | TRANSDERMAL | Status: DC
  Administered 2021-05-23 – 2021-05-28 (×6): 14 mg via TRANSDERMAL
  Filled 2021-05-22 (×7): qty 1

## 2021-05-22 MED ORDER — OLANZAPINE 10 MG PO TBDP
10.0000 mg | ORAL_TABLET | Freq: Three times a day (TID) | ORAL | Status: DC | PRN
  Administered 2021-05-26: 10 mg via ORAL
  Filled 2021-05-22 (×2): qty 1

## 2021-05-22 MED ORDER — NICOTINE 14 MG/24HR TD PT24: 14.0000 mg | MEDICATED_PATCH | Freq: Every day | TRANSDERMAL | 0 refills | Status: DC

## 2021-05-22 MED ORDER — LORAZEPAM 1 MG PO TABS
1.0000 mg | ORAL_TABLET | ORAL | Status: DC | PRN
Start: 2021-05-22 — End: 2021-05-28

## 2021-05-22 MED ORDER — ADULT MULTIVITAMIN W/MINERALS CH
1.0000 | ORAL_TABLET | Freq: Every day | ORAL | Status: DC
  Administered 2021-05-23 – 2021-05-28 (×6): 1 via ORAL
  Filled 2021-05-22 (×7): qty 1

## 2021-05-22 MED ORDER — ACETAMINOPHEN 325 MG PO TABS
650.0000 mg | ORAL_TABLET | Freq: Four times a day (QID) | ORAL | Status: DC | PRN
Start: 2021-05-22 — End: 2021-05-28
  Administered 2021-05-23 – 2021-05-26 (×6): 650 mg via ORAL
  Filled 2021-05-22 (×7): qty 2

## 2021-05-22 MED ORDER — MAGNESIUM HYDROXIDE 400 MG/5ML PO SUSP: 30.0000 mL | Freq: Every day | ORAL | Status: DC | PRN

## 2021-05-22 MED ORDER — FOLIC ACID 1 MG PO TABS: 1.0000 mg | ORAL_TABLET | Freq: Every day | ORAL | Status: DC

## 2021-05-22 MED ORDER — NICOTINE 14 MG/24HR TD PT24: 14.0000 mg | MEDICATED_PATCH | Freq: Every day | TRANSDERMAL | Status: DC

## 2021-05-22 MED ORDER — ALUM & MAG HYDROXIDE-SIMETH 200-200-20 MG/5ML PO SUSP: 30.0000 mL | ORAL | Status: DC | PRN

## 2021-05-22 MED ORDER — HYDROXYZINE HCL 25 MG PO TABS
25.0000 mg | ORAL_TABLET | Freq: Three times a day (TID) | ORAL | Status: DC | PRN
  Administered 2021-05-22 – 2021-05-27 (×7): 25 mg via ORAL
  Filled 2021-05-22 (×3): qty 1
  Filled 2021-05-22: qty 10
  Filled 2021-05-22 (×4): qty 1

## 2021-05-22 MED ORDER — RISPERIDONE 1 MG PO TABS: 1.0000 mg | ORAL_TABLET | Freq: Every day | ORAL | Status: DC

## 2021-05-22 MED ORDER — ENSURE ENLIVE PO LIQD
237.0000 mL | Freq: Two times a day (BID) | ORAL | Status: DC
  Administered 2021-05-23 – 2021-05-28 (×11): 237 mL via ORAL
  Filled 2021-05-22 (×13): qty 237

## 2021-05-22 MED ORDER — THIAMINE HCL 100 MG PO TABS: 100.0000 mg | ORAL_TABLET | Freq: Every day | ORAL | Status: DC

## 2021-05-22 MED ORDER — FOLIC ACID 1 MG PO TABS
1.0000 mg | ORAL_TABLET | Freq: Every day | ORAL | Status: DC
  Administered 2021-05-23 – 2021-05-28 (×6): 1 mg via ORAL
  Filled 2021-05-22 (×7): qty 1

## 2021-05-22 MED ORDER — RISPERIDONE 1 MG PO TABS
1.0000 mg | ORAL_TABLET | Freq: Every day | ORAL | Status: DC
  Administered 2021-05-22 – 2021-05-27 (×7): 1 mg via ORAL
  Filled 2021-05-22 (×10): qty 1

## 2021-05-22 MED ORDER — ZIPRASIDONE MESYLATE 20 MG IM SOLR
20.0000 mg | INTRAMUSCULAR | Status: DC | PRN
Start: 2021-05-22 — End: 2021-05-28

## 2021-05-22 MED ORDER — TRAZODONE HCL 50 MG PO TABS
50.0000 mg | ORAL_TABLET | Freq: Every evening | ORAL | Status: DC | PRN
  Administered 2021-05-22 – 2021-05-27 (×5): 50 mg via ORAL
  Filled 2021-05-22: qty 7
  Filled 2021-05-22 (×5): qty 1

## 2021-05-22 MED ORDER — RISPERIDONE 0.5 MG PO TABS: 1.0000 mg | ORAL_TABLET | Freq: Every day | ORAL | Status: DC

## 2021-05-22 NOTE — Progress Notes (Addendum)
Attempted to call report to [facility] with the number provided by Malva Limes, RN with [facility] [phone], number does not go thru, odd ringing sound is reached.  At approx 1835 [employer] picked pt up to transport over to [facility].  Bag of belongings sent over to [facility] with [employer].

## 2021-05-22 NOTE — Progress Notes (Signed)
Adult Psychoeducational Group Note  Date:  05/22/2021 Time:  8:38 PM  Group Topic/Focus:  Wrap-Up Group:   The focus of this group is to help patients review their daily goal of treatment and discuss progress on daily workbooks.  Participation Level:  Did Not Attend  Participation Quality:   Did Not Attend  Affect:  Did Not Attend  Cognitive:  Did Not Attend  Insight: None  Engagement in Group:  Did Not Attend  Modes of Intervention:  Did Not Attend  Additional Comments:  Pt did not attend evening wrap up group.  Felipa Furnace 05/22/2021, 8:38 PM

## 2021-05-22 NOTE — Discharge Summary (Signed)
. Physician Discharge Summary   Patient name: Ryan Stark  Admit date:     05/19/2021  Discharge date: 05/22/2021  Attending Physician: Emeline General [[id number]]  Discharge Physician: Joseph Art   PCP: Default, Provider, MD     Recommendations at discharge: will need stitches removed D/c to [facility]  Discharge Diagnoses Principal Problem:   Seizure [facility]) Active Problems:   Chronic alcohol use   Suicidal ideation   Tobacco abuse     Hospital Course   No notes on file   * Seizure [facility]) -suspected seizure is felt to be most consistent with alcohol withdrawal -ER discussed with neurology-- loaded with keppra but no need to continue -MRI shows generalized atrophy -TSH, b12 normal   Tobacco abuse -encourage cessation  Suicidal ideation -currently denies any plan/SI -? psychosis -psych has already been consulted-- plan for IVC -has sitter - TSH/B12/prolactin normal -start Risperdal 1mg  QHS  Chronic alcohol use -CIWA protocol -thiamine -no sign of withdrawal -TOC consult for placement        Condition at discharge: stable    Disposition:  [facility]  Discharge time: greater than 30 minutes. Allergies as of 05/22/2021   No Known Allergies      Medication List     TAKE these medications    feeding supplement Liqd Take 237 mLs by mouth 2 (two) times daily between meals.   folic acid 1 MG tablet Commonly known as: FOLVITE Take 1 tablet (1 mg total) by mouth daily. Start taking on: May 23, 2021   multivitamin with minerals Tabs tablet Take 1 tablet by mouth daily.   nicotine 14 mg/24hr patch Commonly known as: NICODERM CQ - dosed in mg/24 hours Place 1 patch (14 mg total) onto the skin daily. Start taking on: May 23, 2021   risperiDONE 1 MG tablet Commonly known as: RISPERDAL Take 1 tablet (1 mg total) by mouth at bedtime.   thiamine 100 MG tablet Take 1 tablet (100 mg total) by mouth daily. Start taking on: May 23, 2021                Discharge Care Instructions  (From admission, onward)           Start     Ordered   05/22/21 0000  Discharge wound care:       Comments: Patient had laceration repair on 10/8-- will need stitches removed in a few days   05/22/21 1313            CT Head Wo Contrast  Result Date: 05/19/2021 CLINICAL DATA:  Drinking continuously since July, intoxicated, suicidal ideations, found on floor in hallway, unwitnessed fall, slurred speech EXAM: CT HEAD WITHOUT CONTRAST TECHNIQUE: Contiguous axial images were obtained from the base of the skull through the vertex without intravenous contrast. Sagittal and coronal MPR images reconstructed from axial data set. COMPARISON:  01/18/2018 FINDINGS: Brain: Normal ventricular morphology. No midline shift or mass effect. Normal appearance of brain parenchyma. No intracranial hemorrhage, mass lesion, or evidence of acute infarction. No extra-axial fluid collections. Vascular: No hyperdense vessels Skull: Intact Sinuses/Orbits: Clear Other: N/A IMPRESSION: Normal exam. Electronically Signed   By: 03/20/2018 M.D.   On: 05/19/2021 16:09   CT Cervical Spine Wo Contrast  Result Date: 05/19/2021 CLINICAL DATA:  07/19/2021, laceration under chin EXAM: CT CERVICAL SPINE WITHOUT CONTRAST TECHNIQUE: Multidetector CT imaging of the cervical spine was performed without intravenous contrast. Multiplanar CT image reconstructions were also generated. COMPARISON:  01/17/2018 FINDINGS: Alignment:  Alignment is grossly anatomic. Skull base and vertebrae: No acute fracture. No primary bone lesion or focal pathologic process. Incidental cervical ribs at C7 again noted. Soft tissues and spinal canal: No prevertebral fluid or swelling. No visible canal hematoma. Disc levels: Stable C4-5 ACDF. Nonsurgical disc spaces are relatively well preserved. Upper chest: Unremarkable. Other: Reconstructed images demonstrate no additional findings. IMPRESSION: 1. No acute cervical spine  fracture. Electronically Signed   By: Sharlet Salina M.D.   On: 05/19/2021 17:07   MR BRAIN WO CONTRAST  Result Date: 05/20/2021 CLINICAL DATA:  35 year old male with refractory seizure. Severe alcohol use. Reportedly having hallucinations prior to the seizure activity. EXAM: MRI HEAD WITHOUT CONTRAST TECHNIQUE: Multiplanar, multiecho pulse sequences of the brain and surrounding structures were obtained without intravenous contrast. COMPARISON:  Head CT 05/19/2021. FINDINGS: Brain: Generalized cerebral volume loss for age. No restricted diffusion to suggest acute infarction. No midline shift, mass effect, evidence of mass lesion, ventriculomegaly, extra-axial collection or acute intracranial hemorrhage. Cervicomedullary junction and pituitary are within normal limits. Thin slice coronal temporal lobe imaging. Hippocampal formations appear symmetric. No abnormal signal or focal volume loss. Other mesial temporal lobe structures likewise appears symmetric. Overall cerebral morphology is within normal limits, no migrational abnormality. No encephalomalacia or chronic cerebral blood products identified. Wallace Cullens and white matter signal within normal limits. Vascular: Major intracranial vascular flow voids are preserved. The left vertebral artery appears to be dominant. Skull and upper cervical spine: Negative. Sinuses/Orbits: Rightward gaze, but otherwise negative orbits. Mild to moderate paranasal sinus mucosal thickening, mostly the ethmoids. No sinus fluid levels. Other: Mastoids are clear. Visible internal auditory structures appear normal. Negative visible scalp and face. IMPRESSION: 1. Age advanced generalized cerebral volume loss but no acute intracranial abnormality, and otherwise negative noncontrast MRI appearance of the brain. 2. Mild paranasal sinus inflammation. Electronically Signed   By: Odessa Fleming M.D.   On: 05/20/2021 10:14   CT Maxillofacial Wo Contrast  Result Date: 05/19/2021 CLINICAL DATA:  Jaw  pain after fall EXAM: CT MAXILLOFACIAL WITHOUT CONTRAST TECHNIQUE: Multidetector CT imaging of the maxillofacial structures was performed. Multiplanar CT image reconstructions were also generated. COMPARISON:  None. FINDINGS: Osseous: No fracture or mandibular dislocation. No destructive process. Orbits: Negative. No traumatic or inflammatory finding. Sinuses: Clear. Soft tissues: Negative. Limited intracranial: No significant or unexpected finding. IMPRESSION: 1. No acute facial bone fracture. Unremarkable appearance of the mandible. Electronically Signed   By: Sharlet Salina M.D.   On: 05/19/2021 16:03   Results for orders placed or performed during the hospital encounter of 05/19/21  Resp Panel by RT-PCR (Flu A&B, Covid) Nasopharyngeal Swab     Status: None   Collection Time: 05/19/21  3:59 PM   Specimen: Nasopharyngeal Swab; Nasopharyngeal(NP) swabs in vial transport medium  Result Value Ref Range Status   SARS Coronavirus 2 by RT PCR NEGATIVE NEGATIVE Final    Comment: (NOTE) SARS-CoV-2 target nucleic acids are NOT DETECTED.  The SARS-CoV-2 RNA is generally detectable in upper respiratory specimens during the acute phase of infection. The lowest concentration of SARS-CoV-2 viral copies this assay can detect is 138 copies/mL. A negative result does not preclude SARS-Cov-2 infection and should not be used as the sole basis for treatment or other patient management decisions. A negative result may occur with  improper specimen collection/handling, submission of specimen other than nasopharyngeal swab, presence of viral mutation(s) within the areas targeted by this assay, and inadequate number of viral copies(<138 copies/mL). A negative result must be combined  with clinical observations, patient history, and epidemiological information. The expected result is Negative.  Fact Sheet for Patients:  BloggerCourse.com  Fact Sheet for Healthcare Providers:   SeriousBroker.it  This test is no t yet approved or cleared by the [place] FDA and  has been authorized for detection and/or diagnosis of SARS-CoV-2 by FDA under an Emergency Use Authorization (EUA). This EUA will remain  in effect (meaning this test can be used) for the duration of the COVID-19 declaration under Section 564(b)(1) of the Act, 21 U.S.C.section 360bbb-3(b)(1), unless the authorization is terminated  or revoked sooner.       Influenza A by PCR NEGATIVE NEGATIVE Final   Influenza B by PCR NEGATIVE NEGATIVE Final    Comment: (NOTE) The Xpert Xpress SARS-CoV-2/FLU/RSV plus assay is intended as an aid in the diagnosis of influenza from Nasopharyngeal swab specimens and should not be used as a sole basis for treatment. Nasal washings and aspirates are unacceptable for Xpert Xpress SARS-CoV-2/FLU/RSV testing.  Fact Sheet for Patients: BloggerCourse.com  Fact Sheet for Healthcare Providers: SeriousBroker.it  This test is not yet approved or cleared by the [place] FDA and has been authorized for detection and/or diagnosis of SARS-CoV-2 by FDA under an Emergency Use Authorization (EUA). This EUA will remain in effect (meaning this test can be used) for the duration of the COVID-19 declaration under Section 564(b)(1) of the Act, 21 U.S.C. section 360bbb-3(b)(1), unless the authorization is terminated or revoked.  Performed at [facility], [address] [place], [place] [address]   MRSA Next Gen by PCR, Nasal     Status: None   Collection Time: 05/19/21 11:34 PM   Specimen: Nasal Mucosa; Nasal Swab  Result Value Ref Range Status   MRSA by PCR Next Gen NOT DETECTED NOT DETECTED Final    Comment: (NOTE) The GeneXpert MRSA Assay (FDA approved for NASAL specimens only), is one component of a comprehensive MRSA colonization surveillance program. It is not intended to  diagnose MRSA infection nor to guide or monitor treatment for MRSA infections. Test performance is not FDA approved in patients less than 14 years old. Performed at [facility], [address] [place], [place] [address]     Signed:  Joseph Art DO [facility] 05/22/2021, 1:13 PM

## 2021-05-22 NOTE — Progress Notes (Signed)
[employer] arrived to transport pt to [facility], informed officers that covid test was no resulted yet. Rn was instructed to call non emergent line once test resulted.

## 2021-05-22 NOTE — Tx Team (Signed)
  Initial Treatment Plan 05/22/2021 8:31 PM [name] OYD:[id number]    PATIENT STRESSORS: Marital or family conflict   Substance abuse     PATIENT STRENGTHS: Ability for insight  Capable of independent living  Communication skills  Physical Health  Work skills    PATIENT IDENTIFIED PROBLEMS: anxiety  worrying  Substance use/abuse                 DISCHARGE CRITERIA:  Ability to meet basic life and health needs Improved stabilization in mood, thinking, and/or behavior Motivation to continue treatment in a less acute level of care Need for constant or close observation no longer present  PRELIMINARY DISCHARGE PLAN: Attend aftercare/continuing care group Attend 12-step recovery group Outpatient therapy Return to previous living arrangement  PATIENT/FAMILY INVOLVEMENT: This treatment plan has been presented to and reviewed with the patient, Ryan Stark.  The patient and family have been given the opportunity to ask questions and make suggestions.  Raylene Miyamoto, RN 05/22/2021, 8:31 PM

## 2021-05-22 NOTE — TOC Transition Note (Signed)
Transition of Care [facility]) - CM/SW Discharge Note   Patient Details  Name: Ryan Stark MRN: [id number] Date of Birth: 1986/03/15  Transition of Care [facility]) CM/SW Contact:  Baldemar Lenis, LCSW Phone Number: 05/22/2021, 3:21 PM   Clinical Narrative:   Patient discharging to [facility]. IVC paperwork has been faxed, transport set for after covid test results at 5 pm.   Report must be called prior to transport to [phone]    Final next level of care: Psychiatric Hospital Barriers to Discharge: Barriers Resolved   Patient Goals and CMS Choice Patient states their goals for this hospitalization and ongoing recovery are:: "I am planning to move to [place] 07/24/21      Discharge Placement                       Discharge Plan and Services In-house Referral: Clinical Social Work                                   Social Determinants of Health (SDOH) Interventions     Readmission Risk Interventions No flowsheet data found.
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
## 2021-05-22 NOTE — Progress Notes (Signed)
  Progress Note    Ryan Stark   EVO:[id number]  DOB: 19-Aug-1985  DOA: 05/19/2021     3 Date of Service: 05/22/2021     Subjective:  Would like to take a shower  Hospital Problems * Seizure [facility]) -suspected seizure is felt to be most consistent with alcohol withdrawal -ER discussed with neurology-- loaded with keppra but no need to continue -MRI shows generalized atrophy -TSH, b12 normal   Tobacco abuse -encourage cessation  Suicidal ideation -currently denies any plan/SI -? psychosis -psych has already been consulted-- plan for IVC -has sitter - TSH/B12/prolactin normal -start Risperdal 1mg  QHS  Chronic alcohol use -CIWA protocol -thiamine -no sign of withdrawal -TOC consult for placement  Medically stable to be placed inpatient psych  Objective Vital signs were reviewed and unremarkable.  Vitals:   05/22/21 0336 05/22/21 0500 05/22/21 0744 05/22/21 1150  BP: 105/61  (!) 98/59 100/67  Pulse: 81  80 83  Resp: 15 11 16 14   Temp: 98.3 F (36.8 C)  98 F (36.7 C) 98.2 F (36.8 C)  TempSrc: Oral  Oral Oral  SpO2: 100%  96% 96%  Weight:      Height:       65.9 kg  Exam  General: Appearance:    Thin male in no acute distress     Lungs:      respirations unlabored  Heart:    Normal heart rate. Normal rhythm. No murmurs, rubs, or gallops.    MS:   All extremities are intact.          Labs / Other Information See above    07/22/21 DO [facility] 05/22/2021, 12:26 PM

## 2021-05-22 NOTE — Progress Notes (Signed)
Pt seen on the unit. Pt denies SI, HI, AVH and pain. Pt rates anxiety 5/10 and denies depression. Pt asking for something to help him sleep. Pt has scheduled Risperdal 1 mg. Explained indications for medications. Pt given PRNs as well for sleep. Will continue to monitor.
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
## 2021-05-22 NOTE — Progress Notes (Signed)
CSW received consult from MD stating patient is medically stable for inpatient psych placement. CSW sent referral to Gwenevere Ghazi with [facility] for review.   Joaquin Courts, MSW, [facility]

## 2021-05-22 NOTE — Progress Notes (Signed)
  Chaplain responding to [facility] consult regarding suicidal ideation. Chaplain introduced spiritual care and offered support. Pt lying in bed with Bible on his lap, sitter present in room. Pt reports, "I've got my Bible and it's helping." Chaplain inquired if pt would like to talk more, but pt stated he has what he needs for now.  Please page as further needs arise.  Maryanna Shape. Carley Hammed, M.Div. [facility] Chaplain Pager [phone] Office [phone]      05/22/21 1152  Clinical Encounter Type  Visited With Patient;Health care provider  Visit Type Follow-up;Spiritual support  Referral From Other (Comment)  Spiritual Encounters  Spiritual Needs Emotional

## 2021-05-22 NOTE — Progress Notes (Signed)
Patient information has been sent to [facility] via secure chat to review for potential admission. Patient has not yet been accepted at this time. Patient meets inpatient criteria per Margaretha Seeds, MD.   Situation ongoing, CSW will continue to monitor and update note as more information becomes available.    Signed:  Corky Crafts, MSW, [place], LCASA 05/22/2021 12:19 PM

## 2021-05-22 NOTE — Progress Notes (Signed)
RN set up transport with [facility] to [facility]

## 2021-05-23 ENCOUNTER — Encounter (HOSPITAL_COMMUNITY): Payer: Self-pay

## 2021-05-23 DIAGNOSIS — F1994 Other psychoactive substance use, unspecified with psychoactive substance-induced mood disorder: Secondary | ICD-10-CM | POA: Diagnosis not present

## 2021-05-23 DIAGNOSIS — F1010 Alcohol abuse, uncomplicated: Secondary | ICD-10-CM

## 2021-05-23 DIAGNOSIS — R45851 Suicidal ideations: Secondary | ICD-10-CM

## 2021-05-23 DIAGNOSIS — R569 Unspecified convulsions: Secondary | ICD-10-CM

## 2021-05-23 DIAGNOSIS — F1090 Alcohol use, unspecified, uncomplicated: Secondary | ICD-10-CM

## 2021-05-23 DIAGNOSIS — F29 Unspecified psychosis not due to a substance or known physiological condition: Secondary | ICD-10-CM | POA: Diagnosis not present

## 2021-05-23 NOTE — Group Note (Signed)
LCSW Group Therapy Notes    Type of Therapy and Topic: Group Therapy: Effective Communication   Participation Level: Active   Description of Group:  In this group patients will be asked to identify their own styles of communication as well as defining and identifying passive, assertive, and aggressive styles of communication. Participants will identify strategies to communicate in a more assertive manner in an effort to appropriately meet their needs. This group will be process-oriented, with patients participating in exploration of their own experiences as well as giving and receiving support and challenge from other group members.   Therapeutic Goals: 1. Patient will identify their personal communication style. 2. Patient will identify passive, assertive, and aggressive forms of communication. 3. Patient will identify strategies for developing more effective communication to appropriately meet their needs.    Summary of Patient Progress: Pt was given one on one session to discuss effective communication due to acuity on the unit. . Pt did not have questions around topic of group discussion.     Therapeutic Modalities:  Communication Skills Solution Focused Therapy Motivational Interviewing    Holiday Mcmenamin MSW, LCSW Clincal Social Worker  [facility]  

## 2021-05-23 NOTE — BHH Group Notes (Signed)
Adult Psychoeducational Group Note  Date:  05/23/2021 Time:  6:05 PM  Group Topic/Focus:  Relaxation  Participation Level:  Did Not Attend   Donell Beers 05/23/2021, 6:05 PM

## 2021-05-23 NOTE — Progress Notes (Signed)
Recreation Therapy Notes  INPATIENT RECREATION THERAPY ASSESSMENT  Patient Details Name: Ryan Stark MRN: [id number] DOB: 12/17/1985 Today's Date: 05/23/2021       Information Obtained From: Patient  Able to Participate in Assessment/Interview: Yes (LRT did hear pt talking to himself while approaching room.)  Patient Presentation: Alert  Reason for Admission (Per Patient): Other (Comments) (Pt stated a recommendation from [facility] on [employer].)  Patient Stressors:  (None)  Coping Skills:   TV, Sports, Write, Music, Exercise, Meditate, Talk, Art, Prayer  Leisure Interests (2+):  Sports - Other (Comment), Individual - Other (Comment) (Write poems)  Frequency of Recreation/Participation: Other (Comment) (Write poems- Whenever he could; Play soccer- Summer)  Awareness of Community Resources:  Yes  Community Resources:  Park, Public affairs consultant  Current Use: Yes  If no, Barriers?:    Expressed Interest in [employer] Information: No  [employer] of Residence:  Engineer, technical sales  Patient Main Form of Transportation: Set designer  Patient Strengths:  Spiritual, Patient  Patient Identified Areas of Improvement:  Take care of self more  Patient Goal for Hospitalization:  "wants to finish program at the other place"  Current SI (including self-harm):  No  Current HI:  No  Current AVH: No  Staff Intervention Plan: Group Attendance, Collaborate with Interdisciplinary Treatment Team  Consent to Intern Participation: N/A    Caroll Rancher, LRT/CTRS Caroll Rancher A 05/23/2021, 2:15 PM

## 2021-05-23 NOTE — BHH Suicide Risk Assessment (Signed)
[facility] Admission Suicide Risk Assessment   Nursing information obtained from:  Patient Demographic factors:  Unemployed, Low socioeconomic status Current Mental Status:  NA Loss Factors:  Financial problems / change in socioeconomic status Historical Factors:  Impulsivity Risk Reduction Factors:  Sense of responsibility to family, Positive social support, Living with another person, especially a relative  Total Time Spent in Direct Patient Care:  I personally spent 70 minutes on the unit in direct patient care. The direct patient care time included face-to-face time with the patient, reviewing the patient's chart, communicating with other professionals, and coordinating care. Greater than 50% of this time was spent in counseling or coordinating care with the patient regarding goals of hospitalization, psycho-education, and discharge planning needs. I was present during assessment by NP Micheline Rough.  Principal Problem: Psychosis, unspecified psychosis type (HCC) Diagnosis:  Principal Problem:   Psychosis, unspecified psychosis type (HCC) Active Problems:   ETOH abuse   Seizure (HCC)   Chronic alcohol use   Suicidal ideation   Substance induced mood disorder (HCC)  Subjective Data: "I drink about a quarter of a 75 mL bottle of gin every day or 3 [employer]." Patient denies recent hospitalization was related to suicide attempt.  He describes his mood as "okay".  He denies suicidal or homicidal thoughts.  He denies AVH.  I was present at time of assessment by NP Micheline Rough, and agree with assessment as per H&P and attestation.  Continued Clinical Symptoms:  Alcohol Use Disorder Identification Test Final Score (AUDIT): 13 The "Alcohol Use Disorders Identification Test", Guidelines for Use in Primary Care, Second Edition.  World Science writer [facility]). Score between 0-7:  no or low risk or alcohol related problems. Score between 8-15:  moderate risk of alcohol related problems. Score between  16-19:  high risk of alcohol related problems. Score 20 or above:  warrants further diagnostic evaluation for alcohol dependence and treatment.   CLINICAL FACTORS:   Depression:   Comorbid alcohol abuse/dependence Impulsivity Alcohol/Substance Abuse/Dependencies Currently Psychotic Unstable or Poor Therapeutic Relationship Previous Psychiatric Diagnoses and Treatments   Musculoskeletal: Strength & Muscle Tone: within normal limits Gait & Station: normal Patient leans: N/A  Psychiatric Specialty Exam:  Presentation  General Appearance: Appropriate for Environment  Eye Contact:Fair  Speech:Clear and Coherent; Normal Rate  Speech Volume:Normal  Handedness:Right   Mood and Affect  Mood:Euthymic  Affect:Appropriate; Congruent   Thought Process  Thought Processes:Coherent; Goal Directed  Descriptions of Associations:Intact  Orientation:Full (Time, Place and Person)  Thought Content:Logical  History of Schizophrenia/Schizoaffective disorder:No  Duration of Psychotic Symptoms:No data recorded Hallucinations:Hallucinations: None  Ideas of Reference:None  Suicidal Thoughts:Suicidal Thoughts: No  Homicidal Thoughts:Homicidal Thoughts: No   Sensorium  Memory:Immediate Good; Recent Good; Remote Fair  Judgment:Fair  Insight:Fair   Executive Functions  Concentration:Good  Attention Span:Good  Recall:Good  Fund of Knowledge:Good  Language:Good   Psychomotor Activity  Psychomotor Activity:Psychomotor Activity: Normal   Assets  Assets:Communication Skills; Desire for Improvement; Housing; Social Support; Resilience   Sleep  Sleep:Sleep: Fair Number of Hours of Sleep: 5    Physical Exam: Physical Exam Vitals and nursing note reviewed.  Chaperone present Pulmonary:     Effort: Pulmonary effort is normal.  Musculoskeletal:        General: Normal range of motion.     Cervical back: Normal range of motion.  Neurological:     General: No  focal deficit present.     Mental Status: He is alert and oriented to person, place, and time.  Psychiatric:  Attention and Perception: Attention and perception normal. He does not perceive auditory or visual hallucinations.        Mood and Affect: Mood and affect normal.        Speech: Speech normal.        Behavior: Behavior normal. Behavior is cooperative.        Thought Content: Thought content normal. Thought content is not paranoid or delusional. Thought content does not include homicidal or suicidal ideation. Thought content does not include homicidal or suicidal plan.        Cognition and Memory: Cognition normal.    ROS Review of Systems  Constitutional: Negative.  Negative for fever.  HENT: Negative.  Negative for congestion and sore throat.   Respiratory: Negative.  Negative for cough.   Gastrointestinal: Negative.   Genitourinary: Negative.   Musculoskeletal: Negative.   Neurological: Negative.   Psychiatric/Behavioral:  Positive for substance abuse.    Blood pressure 102/67, pulse (!) 105, temperature 98.4 F (36.9 C), temperature source Oral, resp. rate 18, height 5\' 9"  (1.753 m), weight 59.9 kg, SpO2 98 %. Body mass index is 19.49 kg/m.   COGNITIVE FEATURES THAT CONTRIBUTE TO RISK:  Thought constriction (tunnel vision)    SUICIDE RISK:   Moderate:  Frequent suicidal ideation with limited intensity, and duration, some specificity in terms of plans, no associated intent, good self-control, limited dysphoria/symptomatology, some risk factors present, and identifiable protective factors, including available and accessible social support.  PLAN OF CARE:   Agree with plan of care and H&P by NP :  Daily contact with patient to assess and evaluate symptoms and progress in treatment and Medication management 1. Admit for crisis management and stabilization, estimated length of stay 3-5 days.  2. Medication management to reduce current symptoms to base line and  improve the patient's overall level of functioning: See MD's SRA for plan of care. 3. Treat health problems as indicated.  4. Develop treatment plan to decrease risk of relapse upon discharge and the need for readmission.  5. Psycho-social education regarding relapse prevention and self care.  6. Health care follow up as needed for medical problems.  7. Review, reconcile, and reinstate any pertinent home medications for other health issues where appropriate. 8. Call for consults with hospitalist for any additional specialty patient care services as needed.    Observation Level/Precautions:  15 minute checks  Laboratory:  Labs reviewed: CMP with Total Protein 6.3. CBC within normal limits. Lipid profile with LDL 102. Vitamin B12 776. A1c 5.6. TSH 1.348. UDS positive for THC and benzos (hospital admin). BAL <10. CT cervical spine, head and maxillofacial are negative, MRI head shows Generalized cerebral volume loss for age.   Psychotherapy:  Group therapy  Medications:  See MAR  Consultations: TBD   Discharge Concerns:  safety, substance abuse leading to suicidal ideation and medical complications  Estimated LOS: 3-5 days  Other:      Physician Treatment Plan for Primary Diagnosis: Psychosis, unspecified psychosis type (HCC) Long Term Goal(s): Improvement in symptoms so as ready for discharge   Short Term Goals: Ability to identify changes in lifestyle to reduce recurrence of condition will improve, Ability to verbalize feelings will improve, Ability to disclose and discuss suicidal ideas, Ability to identify and develop effective coping behaviors will improve, and Ability to identify triggers associated with substance abuse/mental health issues will improve   Physician Treatment Plan for Secondary Diagnosis: Principal Problem:   Psychosis, unspecified psychosis type (HCC) Active Problems:   ETOH  abuse   Seizure (HCC)   Chronic alcohol use   Suicidal ideation   Long Term Goal(s):  Improvement in symptoms so as ready for discharge   Short Term Goals: Ability to identify changes in lifestyle to reduce recurrence of condition will improve, Ability to verbalize feelings will improve, Ability to disclose and discuss suicidal ideas, Ability to identify and develop effective coping behaviors will improve, and Ability to identify triggers associated with substance abuse/mental health issues will improve   I certify that inpatient services furnished can reasonably be expected to improve the patient's condition.   Mariel Craft, MD 05/23/2021, 5:20 PM

## 2021-05-23 NOTE — Progress Notes (Signed)
Adult Psychoeducational Group Note  Date:  05/23/2021 Time:  8:08 PM  Group Topic/Focus:  Wrap-Up Group:   The focus of this group is to help patients review their daily goal of treatment and discuss progress on daily workbooks.  Participation Level:  Did Not Attend  Participation Quality:   Did Not Attend  Affect:   Did Not Attend  Cognitive:   Did Not Attend  Insight: None  Engagement in Group:   Did Not Attend  Modes of Intervention:   Did Not Attend  Additional Comments:  Pt did not attend evening wrap up group tonight.  Felipa Furnace 05/23/2021, 8:08 PM

## 2021-05-23 NOTE — BH IP Treatment Plan (Signed)
Interdisciplinary Treatment and Diagnostic Plan Update  05/23/2021 Time of Session: 9:55am  Cleon Signorelli MRN: [id number]  Principal Diagnosis: Psychosis, unspecified psychosis type ([place])  Secondary Diagnoses: Principal Problem:   Psychosis, unspecified psychosis type ([place]) Active Problems:   ETOH abuse   Seizure ([place])   Chronic alcohol use   Suicidal ideation   Current Medications:  Current Facility-Administered Medications  Medication Dose Route Frequency Provider Last Rate Last Admin   acetaminophen (TYLENOL) tablet 650 mg  650 mg Oral Q6H PRN Ethelene Hal, NP       alum & mag hydroxide-simeth (MAALOX/MYLANTA) 200-200-20 MG/5ML suspension 30 mL  30 mL Oral Q4H PRN Ethelene Hal, NP       feeding supplement (ENSURE ENLIVE / ENSURE PLUS) liquid 237 mL  237 mL Oral BID BM Ethelene Hal, NP   237 mL at 77/82/42 3536   folic acid (FOLVITE) tablet 1 mg  1 mg Oral Daily Ethelene Hal, NP   1 mg at 05/23/21 0825   hydrOXYzine (ATARAX/VISTARIL) tablet 25 mg  25 mg Oral TID PRN Ethelene Hal, NP   25 mg at 05/22/21 2120   OLANZapine zydis (ZYPREXA) disintegrating tablet 10 mg  10 mg Oral Q8H PRN Ethelene Hal, NP       And   LORazepam (ATIVAN) tablet 1 mg  1 mg Oral PRN Ethelene Hal, NP       And   ziprasidone (GEODON) injection 20 mg  20 mg Intramuscular PRN Ethelene Hal, NP       magnesium hydroxide (MILK OF MAGNESIA) suspension 30 mL  30 mL Oral Daily PRN Ethelene Hal, NP       multivitamin with minerals tablet 1 tablet  1 tablet Oral Daily Ethelene Hal, NP   1 tablet at 05/23/21 0825   nicotine (NICODERM CQ - dosed in mg/24 hours) patch 14 mg  14 mg Transdermal Daily Lavella Hammock, MD   14 mg at 05/23/21 0825   risperiDONE (RISPERDAL) tablet 1 mg  1 mg Oral QHS Ethelene Hal, NP   1 mg at 05/22/21 2120   traZODone (DESYREL) tablet 50 mg  50 mg Oral QHS PRN Ethelene Hal, NP   50 mg  at 05/22/21 2120   PTA Medications: Medications Prior to Admission  Medication Sig Dispense Refill Last Dose   feeding supplement (ENSURE ENLIVE / ENSURE PLUS) LIQD Take 237 mLs by mouth 2 (two) times daily between meals. 144 mL 12    folic acid (FOLVITE) 1 MG tablet Take 1 tablet (1 mg total) by mouth daily.      Multiple Vitamin (MULTIVITAMIN WITH MINERALS) TABS tablet Take 1 tablet by mouth daily. (Patient not taking: No sig reported) 30 tablet 0    nicotine (NICODERM CQ - DOSED IN MG/24 HOURS) 14 mg/24hr patch Place 1 patch (14 mg total) onto the skin daily. 28 patch 0    risperiDONE (RISPERDAL) 1 MG tablet Take 1 tablet (1 mg total) by mouth at bedtime.      thiamine 100 MG tablet Take 1 tablet (100 mg total) by mouth daily.       Patient Stressors: Marital or family conflict   Substance abuse    Patient Strengths: Ability for insight  Capable of independent living  Communication skills  Physical Health  Work skills   Treatment Modalities: Medication Management, Group therapy, Case management,  1 to 1 session with clinician, Psychoeducation, Recreational therapy.   Physician  Treatment Plan for Primary Diagnosis: Psychosis, unspecified psychosis type ([place]) Long Term Goal(s):     Short Term Goals:    Medication Management: Evaluate patient's response, side effects, and tolerance of medication regimen.  Therapeutic Interventions: 1 to 1 sessions, Unit Group sessions and Medication administration.  Evaluation of Outcomes: Not Met  Physician Treatment Plan for Secondary Diagnosis: Principal Problem:   Psychosis, unspecified psychosis type ([place]) Active Problems:   ETOH abuse   Seizure ([place])   Chronic alcohol use   Suicidal ideation  Long Term Goal(s):     Short Term Goals:       Medication Management: Evaluate patient's response, side effects, and tolerance of medication regimen.  Therapeutic Interventions: 1 to 1 sessions, Unit Group sessions and Medication  administration.  Evaluation of Outcomes: Not Met   RN Treatment Plan for Primary Diagnosis: Psychosis, unspecified psychosis type ([place]) Long Term Goal(s): Knowledge of disease and therapeutic regimen to maintain health will improve  Short Term Goals: Ability to remain free from injury will improve, Ability to participate in decision making will improve, Ability to verbalize feelings will improve, Ability to disclose and discuss suicidal ideas, and Ability to identify and develop effective coping behaviors will improve  Medication Management: RN will administer medications as ordered by provider, will assess and evaluate patient's response and provide education to patient for prescribed medication. RN will report any adverse and/or side effects to prescribing provider.  Therapeutic Interventions: 1 on 1 counseling sessions, Psychoeducation, Medication administration, Evaluate responses to treatment, Monitor vital signs and CBGs as ordered, Perform/monitor CIWA, COWS, AIMS and Fall Risk screenings as ordered, Perform wound care treatments as ordered.  Evaluation of Outcomes: Not Met   LCSW Treatment Plan for Primary Diagnosis: Psychosis, unspecified psychosis type ([place]) Long Term Goal(s): Safe transition to appropriate next level of care at discharge, Engage patient in therapeutic group addressing interpersonal concerns.  Short Term Goals: Engage patient in aftercare planning with referrals and resources, Increase social support, Increase emotional regulation, Facilitate acceptance of mental health diagnosis and concerns, Identify triggers associated with mental health/substance abuse issues, and Increase skills for wellness and recovery  Therapeutic Interventions: Assess for all discharge needs, 1 to 1 time with Social worker, Explore available resources and support systems, Assess for adequacy in community support network, Educate family and significant other(s) on suicide prevention, Complete  Psychosocial Assessment, Interpersonal group therapy.  Evaluation of Outcomes: Not Met   Progress in Treatment: Attending groups: Yes. Participating in groups: Yes. Taking medication as prescribed: Yes. Toleration medication: Yes. Family/Significant other contact made: Yes, individual(s) contacted:  Father  Patient understands diagnosis: Yes. Discussing patient identified problems/goals with staff: Yes. Medical problems stabilized or resolved: Yes. Denies suicidal/homicidal ideation: Yes. Issues/concerns per patient self-inventory: No.   New problem(s) identified: No, Describe:  None   New Short Term/Long Term Goal(s): medication stabilization, elimination of SI thoughts, development of comprehensive mental wellness plan.   Patient Goals: "To go back to my outpatient program for my drinking"  Discharge Plan or Barriers: Patient recently admitted. CSW will continue to follow and assess for appropriate referrals and possible discharge planning.   Reason for Continuation of Hospitalization: Anxiety Medication stabilization Withdrawal symptoms  Estimated Length of Stay: 3 to 5 days    Scribe for Treatment Team: Darleen Crocker, Latanya Presser 05/23/2021 2:55 PM

## 2021-05-23 NOTE — BHH Group Notes (Signed)
The focus of this group is to help patients establish daily goals to achieve during treatment and discuss how the patient can incorporate goal setting into their daily lives to aide in recovery.  Pt did not attend group 
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
## 2021-05-23 NOTE — Group Note (Signed)
Recreation Therapy Group Note   Group Topic:Self-Esteem  Group Date: 05/23/2021 Start Time: 1000 End Time: 1020 Facilitators: Caroll Rancher, LRT/CTRS Location: [address] Dayroom  Goal Area(s) Addresses:  Patient will identify all positive things about themself. Patient will successfully identify influences and memorable moments they admire. Patient will acknowledge the benefit of healthy self-esteem. Patient will endorse understanding of ways to increase self-esteem.   Group Description:   LRT began group session with open dialogue asking the patients to define self-esteem and verbally identify positive qualities and traits people may possess. Patients were then instructed to design a personalized license plate, with words and drawings, representing at least 3 positive things about themselves. Pts were encouraged to include favorites, things they are proud of, what they enjoy doing, and dreams for their future. If a patient had a life motto or a meaningful phase that expressed their life values, pt's were asked to incorporate that into their design as well. Patients were given the opportunity to share their completed work with the group.  LRT played music while patients worked on their plates.   Affect/Mood: Appropriate   Participation Level: Engaged   Participation Quality: Independent   Behavior: Appropriate   Speech/Thought Process: Focused   Insight: Good   Judgement: Good   Modes of Intervention: Art   Patient Response to Interventions:  Engaged   Education Outcome:  Acknowledges education and In group clarification offered    Clinical Observations/Individualized Feedback: Pt came in late, LRT explained the activity to pt.  Pt was bright and asked questions for clarification.  Pt completed his personalized plate and included the following information: lives in [facility], likes gospel music and the colors yellow, red and blue to represent his country in [place].     Plan: Continue to engage patient in RT group sessions 2-3x/week.   Caroll Rancher, LRT/CTRS 05/23/2021 1:43 PM

## 2021-05-23 NOTE — H&P (Signed)
Psychiatric Admission Assessment Adult  Patient Identification: Ryan Stark MRN:  [id number] Date of Evaluation:  05/23/2021 Chief Complaint:  Psychosis, unspecified psychosis type (HCC) [F29] Principal Diagnosis: Psychosis, unspecified psychosis type (HCC) Diagnosis:  Principal Problem:   Psychosis, unspecified psychosis type (HCC) Active Problems:   ETOH abuse   Seizure (HCC)   Chronic alcohol use   Suicidal ideation  History of Present Illness: Patient was seen, chart reviewed and case discussed with Dr Viviano Simas. Patient is a 35 year old male who was admitted to [facility] for stabilization and medication management after a 3 day hospitalization for alcohol withdrawal seizure. Patient has a psychiatric history significant for alcohol abuse and depression. Patient stated he has been drinking heavily for a long time. He stated he stopped in January but started drinking again in July. He lives with his girlfriend who is concerned about his drinking. He is currently unemployed. He was studying networking but stopped due to no money to pay for the courses. He is from [place] and [place]. His mother is still in [place] and his father lives here. He has no children. Apparently she was at home with him when she heard a thud and found him lying on the floor having a seizure that lasted about 1 minute. He had hit his chin on the way down and has sutures on the right side just below his jaw line. His girlfriend took him to [facility] in [place] and they recommended he go to the ER for laceration repair and alcohol withdrawal. He went to [facility] and was admitted to the medical floor from the ED.  He denies he is depressed or anxious. He stated he drinks 3 beers a day and 1/4 of a bottle of Gin. He stated he went to [facility] last month, 9-24 to 9-27 for alcohol detox. He stated he was discharged with some homework and wants to go home to complete that. He is asking to be discharged today. He is agreeable  to stay until collateral can be obtained regarding the events leading up to his admission. There is some discrepancy in the notes as to whether he had a rope and was trying to hang himself or if he had been drinking and passed out. He stated he felt dizzy and passed out. He stated he did try to hang himself in December but he was not hospitalized for that. 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 This was discussed with him and he was strongly encouraged to cease drinking. He stated that sometimes his girlfriend thinks he is drunk when he is not. This was discussed as well and explained that this is likely due to the brain shrinkage he has caused with alcohol. He does seem to have some difficulty remembering dates and time spans of events in his life. He is calm and cooperative and his thought process is linear and coherent. He has an accent which makes it difficult to understand him at times. He stated he took Prozac for depression a long time ago but denies depression today. He denies anxiety. He denies homicidal ideation, paranoia and delusions. He does not appear to be responding to internal stimuli. He is able to contract for safety on the unit. We will revisit medications once we obtain collateral for family. Currently patient is denying any depressive symptoms and does not appear to be psychotic.    Associated Signs/Symptoms: Depression Symptoms:   patient denies depressive symptoms  Duration of Depression Symptoms: No data recorded (Hypo) Manic Symptoms:   none observed or expressed by patient Anxiety Symptoms:   patient denies anxiety and worry Psychotic Symptoms:  Hallucinations: None PTSD Symptoms: Patient denies PTSD symptoms, trauma and abuse Total Time spent with patient: 45 minutes  Past Psychiatric History: Alcohol abuse, depression  Is the patient at risk to self? No.  Has the patient been a risk to self in the past 6 months? No.  Has the patient  been a risk to self within the distant past? No.  Is the patient a risk to others? No.  Has the patient been a risk to others in the past 6 months? No.  Has the patient been a risk to others within the distant past? No.   Prior Inpatient Therapy:   Prior Outpatient Therapy:    Alcohol Screening: 1. How often do you have a drink containing alcohol?: 4 or more times a week 2. How many drinks containing alcohol do you have on a typical day when you are drinking?: 10 or more 3. How often do you have six or more drinks on one occasion?: Daily or almost daily AUDIT-C Score: 12 4. How often during the last year have you found that you were not able to stop drinking once you had started?: Never 5. How often during the last year have you failed to do what was normally expected from you because of drinking?: Never 6. How often during the last year have you needed a first drink in the morning to get yourself going after a heavy drinking session?: Never 7. How often during the last year have you had a feeling of guilt of remorse after drinking?: Less than monthly 8. How often during the last year have you been unable to remember what happened the night before because you had been drinking?: Never 9. Have you or someone else been injured as a result of your drinking?: No 10. Has a relative or friend or a doctor or another health worker been concerned about your drinking or suggested you cut down?: No Alcohol Use Disorder Identification Test Final Score (AUDIT): 13 Alcohol Brief Interventions/Follow-up: Patient Refused Substance Abuse History in the last 12 months:  Yes.   Consequences of Substance Abuse: Medical Consequences:  medical and mental health hospital admissions for alcohol abuse Previous Psychotropic Medications: No  Psychological Evaluations: No  Past Medical History:  Past Medical History:  Diagnosis Date   Alcohol abuse    Cervical spine fracture (HCC) 08/19/2016    Past Surgical  History:  Procedure Laterality Date   ANTERIOR CERVICAL DECOMP/DISCECTOMY FUSION N/A 08/20/2016   Procedure: ANTERIOR CERVICAL DECOMPRESSION/DISCECTOMY FUSION CERVICAL FOUR - CERVICAL FIVE;  Surgeon: Shirlean Kelly, MD;  Location: [facility] OR;  Service: Neurosurgery;  Laterality: N/A;  ANTERIOR CERVICAL DECOMPRESSION/DISCECTOMY FUSION CERVICAL FOUR - CERVICAL FIVE   Family History: History reviewed. No pertinent family history. Family Psychiatric  History: Patient did not disclose Tobacco Screening:   Social History:  Social History   Substance and Sexual Activity  Alcohol Use Yes   Comment: Heavy. Binge drinking     Social History   Substance and Sexual Activity  Drug Use Not Currently   Types: Marijuana   Comment: denies current use    Additional Social History: Marital status: Long term relationship Long term relationship, how long?: 3 What types of issues is patient dealing with in the relationship?: Denies Additional relationship information: n.a Are you sexually active?: Yes What is your sexual orientation?:  Heterosexual Has your sexual activity been affected by drugs, alcohol, medication, or emotional stress?: Denies Does patient have children?: No                         Allergies:  No Known Allergies Lab Results:  Results for orders placed or performed during the hospital encounter of 05/19/21 (from the past 48 hour(s))  Glucose, capillary     Status: None   Collection Time: 05/21/21  4:08 PM  Result Value Ref Range   Glucose-Capillary 99 70 - 99 mg/dL    Comment: Glucose reference range applies only to samples taken after fasting for at least 8 hours.  Glucose, capillary     Status: None   Collection Time: 05/21/21  8:26 PM  Result Value Ref Range   Glucose-Capillary 82 70 - 99 mg/dL    Comment: Glucose reference range applies only to samples taken after fasting for at least 8 hours.  Glucose, capillary     Status: Abnormal   Collection Time: 05/21/21 11:34  PM  Result Value Ref Range   Glucose-Capillary 118 (H) 70 - 99 mg/dL    Comment: Glucose reference range applies only to samples taken after fasting for at least 8 hours.  Lipid panel     Status: Abnormal   Collection Time: 05/22/21  1:32 AM  Result Value Ref Range   Cholesterol 171 0 - 200 mg/dL   Triglycerides 81 <161 mg/dL   HDL 53 >09 mg/dL   Total CHOL/HDL Ratio 3.2 RATIO   VLDL 16 0 - 40 mg/dL   LDL Cholesterol 604 (H) 0 - 99 mg/dL    Comment:        Total Cholesterol/HDL:CHD Risk Coronary Heart Disease Risk Table                     Men   Women  1/2 Average Risk   3.4   3.3  Average Risk       5.0   4.4  2 X Average Risk   9.6   7.1  3 X Average Risk  23.4   11.0        Use the calculated Patient Ratio above and the CHD Risk Table to determine the patient's CHD Risk.        ATP III CLASSIFICATION (LDL):  <100     mg/dL   Optimal  540-981  mg/dL   Near or Above                    Optimal  130-159  mg/dL   Borderline  191-478  mg/dL   High  >295     mg/dL   Very High Performed at [facility], [address] [place], [place] [address]   Hemoglobin A1c     Status: None   Collection Time: 05/22/21  1:32 AM  Result Value Ref Range   Hgb A1c MFr Bld 5.6 4.8 - 5.6 %    Comment: (NOTE) Pre diabetes:          5.7%-6.4%  Diabetes:              >6.4%  Glycemic control for   <7.0% adults with diabetes    Mean Plasma Glucose 114.02 mg/dL    Comment: Performed at [facility], [address] [place], [place] [address]  Glucose, capillary     Status: Abnormal   Collection Time: 05/22/21  3:34 AM  Result Value Ref Range   Glucose-Capillary 101 (H) 70 - 99 mg/dL    Comment: Glucose reference range applies only to samples taken after fasting for at least 8 hours.  Glucose, capillary     Status: Abnormal   Collection Time: 05/22/21  7:42 AM  Result Value Ref Range   Glucose-Capillary 102 (H) 70 - 99 mg/dL    Comment: Glucose reference range applies only to  samples taken after fasting for at least 8 hours.  Resp Panel by RT-PCR (Flu A&B, Covid) Nasopharyngeal Swab     Status: None   Collection Time: 05/22/21  2:20 PM   Specimen: Nasopharyngeal Swab; Nasopharyngeal(NP) swabs in vial transport medium  Result Value Ref Range   SARS Coronavirus 2 by RT PCR NEGATIVE NEGATIVE    Comment: (NOTE) SARS-CoV-2 target nucleic acids are NOT DETECTED.  The SARS-CoV-2 RNA is generally detectable in upper respiratory specimens during the acute phase of infection. The lowest concentration of SARS-CoV-2 viral copies this assay can detect is 138 copies/mL. A negative result does not preclude SARS-Cov-2 infection and should not be used as the sole basis for treatment or other patient management decisions. A negative result may occur with  improper specimen collection/handling, submission of specimen other than nasopharyngeal swab, presence of viral mutation(s) within the areas targeted by this assay, and inadequate number of viral copies(<138 copies/mL). A negative result must be combined with clinical observations, patient history, and epidemiological information. The expected result is Negative.  Fact Sheet for Patients:  BloggerCourse.com  Fact Sheet for Healthcare Providers:  SeriousBroker.it  This test is no t yet approved or cleared by the [place] FDA and  has been authorized for detection and/or diagnosis of SARS-CoV-2 by FDA under an Emergency Use Authorization (EUA). This EUA will remain  in effect (meaning this test can be used) for the duration of the COVID-19 declaration under Section 564(b)(1) of the Act, 21 U.S.C.section 360bbb-3(b)(1), unless the authorization is terminated  or revoked sooner.       Influenza A by PCR NEGATIVE NEGATIVE   Influenza B by PCR NEGATIVE NEGATIVE    Comment: (NOTE) The Xpert Xpress SARS-CoV-2/FLU/RSV plus assay is intended as an aid in the diagnosis  of influenza from Nasopharyngeal swab specimens and should not be used as a sole basis for treatment. Nasal washings and aspirates are unacceptable for Xpert Xpress SARS-CoV-2/FLU/RSV testing.  Fact Sheet for Patients: BloggerCourse.com  Fact Sheet for Healthcare Providers: SeriousBroker.it  This test is not yet approved or cleared by the [place] FDA and has been authorized for detection and/or diagnosis of SARS-CoV-2 by FDA under an Emergency Use Authorization (EUA). This EUA will remain in effect (meaning this test can be used) for the duration of the COVID-19 declaration under Section 564(b)(1) of the Act, 21 U.S.C. section 360bbb-3(b)(1), unless the authorization is terminated or revoked.  Performed at [facility], [address], [place], [place] [address]     Blood Alcohol level:  Lab Results  Component Value Date   ETH <10 05/19/2021   ETH 363 (HH) 05/31/2018    Metabolic Disorder Labs:  Lab Results  Component Value Date   HGBA1C 5.6 05/22/2021   MPG 114.02 05/22/2021   Lab Results  Component Value Date   PROLACTIN 9.3 05/21/2021   PROLACTIN 36.7 (H) 05/20/2021   Lab Results  Component Value Date   CHOL 171 05/22/2021   TRIG 81 05/22/2021   HDL 53 05/22/2021   CHOLHDL  3.2 05/22/2021   VLDL 16 05/22/2021   LDLCALC 102 (H) 05/22/2021    Current Medications: Current Facility-Administered Medications  Medication Dose Route Frequency Provider Last Rate Last Admin   acetaminophen (TYLENOL) tablet 650 mg  650 mg Oral Q6H PRN Laveda Abbe, NP       alum & mag hydroxide-simeth (MAALOX/MYLANTA) 200-200-20 MG/5ML suspension 30 mL  30 mL Oral Q4H PRN Laveda Abbe, NP       feeding supplement (ENSURE ENLIVE / ENSURE PLUS) liquid 237 mL  237 mL Oral BID BM Laveda Abbe, NP   237 mL at 05/23/21 1042   folic acid (FOLVITE) tablet 1 mg  1 mg Oral Daily Laveda Abbe, NP   1  mg at 05/23/21 0825   hydrOXYzine (ATARAX/VISTARIL) tablet 25 mg  25 mg Oral TID PRN Laveda Abbe, NP   25 mg at 05/22/21 2120   OLANZapine zydis (ZYPREXA) disintegrating tablet 10 mg  10 mg Oral Q8H PRN Laveda Abbe, NP       And   LORazepam (ATIVAN) tablet 1 mg  1 mg Oral PRN Laveda Abbe, NP       And   ziprasidone (GEODON) injection 20 mg  20 mg Intramuscular PRN Laveda Abbe, NP       magnesium hydroxide (MILK OF MAGNESIA) suspension 30 mL  30 mL Oral Daily PRN Laveda Abbe, NP       multivitamin with minerals tablet 1 tablet  1 tablet Oral Daily Laveda Abbe, NP   1 tablet at 05/23/21 0825   nicotine (NICODERM CQ - dosed in mg/24 hours) patch 14 mg  14 mg Transdermal Daily Mariel Craft, MD   14 mg at 05/23/21 0825   risperiDONE (RISPERDAL) tablet 1 mg  1 mg Oral QHS Laveda Abbe, NP   1 mg at 05/22/21 2120   traZODone (DESYREL) tablet 50 mg  50 mg Oral QHS PRN Laveda Abbe, NP   50 mg at 05/22/21 2120   PTA Medications: Medications Prior to Admission  Medication Sig Dispense Refill Last Dose   feeding supplement (ENSURE ENLIVE / ENSURE PLUS) LIQD Take 237 mLs by mouth 2 (two) times daily between meals. 237 mL 12    folic acid (FOLVITE) 1 MG tablet Take 1 tablet (1 mg total) by mouth daily.      Multiple Vitamin (MULTIVITAMIN WITH MINERALS) TABS tablet Take 1 tablet by mouth daily. (Patient not taking: No sig reported) 30 tablet 0    nicotine (NICODERM CQ - DOSED IN MG/24 HOURS) 14 mg/24hr patch Place 1 patch (14 mg total) onto the skin daily. 28 patch 0    risperiDONE (RISPERDAL) 1 MG tablet Take 1 tablet (1 mg total) by mouth at bedtime.      thiamine 100 MG tablet Take 1 tablet (100 mg total) by mouth daily.       Musculoskeletal: Strength & Muscle Tone: within normal limits Gait & Station: normal Patient leans: N/A  Psychiatric Specialty Exam:  Presentation  General Appearance:  Appropriate for  Environment Eye Contact: Fair Speech: Clear and Coherent; Normal Rate Speech Volume: Normal Handedness: Right  Mood and Affect  Mood: Euthymic Affect: Appropriate; Congruent  Thought Process  Thought Processes: Coherent; Goal Directed Duration of Psychotic Symptoms: No data recorded Past Diagnosis of Schizophrenia or Psychoactive disorder: No  Descriptions of Associations:Intact Orientation:Full (Time, Place and Person) Thought Content:Logical Hallucinations:Hallucinations: None Ideas of Reference:None Suicidal Thoughts:Suicidal Thoughts: No Homicidal  Thoughts:Homicidal Thoughts: No  Sensorium  Memory: Immediate Good; Recent Good; Remote Fair Judgment: Fair Insight: Fair  Executive Functions  Concentration: Good Attention Span: Good Recall: Good Fund of Knowledge: Good Language: Good  Psychomotor Activity  Psychomotor Activity: Psychomotor Activity: Normal  Assets  Assets: Communication Skills; Desire for Improvement; Housing; Social Support; Resilience  Sleep  Sleep: Sleep: Fair Number of Hours of Sleep: 5   Physical Exam: Physical Exam Vitals and nursing note reviewed.  Pulmonary:     Effort: Pulmonary effort is normal.  Musculoskeletal:        General: Normal range of motion.     Cervical back: Normal range of motion.  Neurological:     General: No focal deficit present.     Mental Status: He is alert and oriented to person, place, and time.  Psychiatric:        Attention and Perception: Attention and perception normal. He does not perceive auditory or visual hallucinations.        Mood and Affect: Mood and affect normal.        Speech: Speech normal.        Behavior: Behavior normal. Behavior is cooperative.        Thought Content: Thought content normal. Thought content is not paranoid or delusional. Thought content does not include homicidal or suicidal ideation. Thought content does not include homicidal or suicidal plan.         Cognition and Memory: Cognition normal.   Review of Systems  Constitutional: Negative.  Negative for fever.  HENT: Negative.  Negative for congestion and sore throat.   Respiratory: Negative.  Negative for cough.   Gastrointestinal: Negative.   Genitourinary: Negative.   Musculoskeletal: Negative.   Neurological: Negative.   Psychiatric/Behavioral:  Positive for substance abuse.    Blood pressure (!) 133/122, pulse 64, temperature 98.4 F (36.9 C), temperature source Oral, resp. rate 18, height 5\' 9"  (1.753 m), weight 59.9 kg, SpO2 98 %. Body mass index is 19.49 kg/m.  Treatment Plan Summary: Daily contact with patient to assess and evaluate symptoms and progress in treatment and Medication management 1. Admit for crisis management and stabilization, estimated length of stay 3-5 days.  2. Medication management to reduce current symptoms to base line and improve the patient's overall level of functioning: See MD's SRA for plan of care. 3. Treat health problems as indicated.  4. Develop treatment plan to decrease risk of relapse upon discharge and the need for readmission.  5. Psycho-social education regarding relapse prevention and self care.  6. Health care follow up as needed for medical problems.  7. Review, reconcile, and reinstate any pertinent home medications for other health issues where appropriate. 8. Call for consults with hospitalist for any additional specialty patient care services as needed.   Observation Level/Precautions:  15 minute checks  Laboratory:  Labs reviewed: CMP with Total Protein 6.3. CBC within normal limits. Lipid profile with LDL 102. Vitamin B12 776. A1c 5.6. TSH 1.348. UDS positive for THC and benzos (hospital admin). BAL <10. CT cervical spine, head and maxillofacial are negative, MRI head shows Generalized cerebral volume loss for age.   Psychotherapy:  Group therapy  Medications:  See MAR  Consultations: TBD   Discharge Concerns:  safety, substance  abuse leading to suicidal ideation and medical complications  Estimated LOS: 3-5 days  Other:     Physician Treatment Plan for Primary Diagnosis: Psychosis, unspecified psychosis type (HCC) Long Term Goal(s): Improvement in symptoms so as ready for  discharge  Short Term Goals: Ability to identify changes in lifestyle to reduce recurrence of condition will improve, Ability to verbalize feelings will improve, Ability to disclose and discuss suicidal ideas, Ability to identify and develop effective coping behaviors will improve, and Ability to identify triggers associated with substance abuse/mental health issues will improve  Physician Treatment Plan for Secondary Diagnosis: Principal Problem:   Psychosis, unspecified psychosis type (HCC) Active Problems:   ETOH abuse   Seizure (HCC)   Chronic alcohol use   Suicidal ideation  Long Term Goal(s): Improvement in symptoms so as ready for discharge  Short Term Goals: Ability to identify changes in lifestyle to reduce recurrence of condition will improve, Ability to verbalize feelings will improve, Ability to disclose and discuss suicidal ideas, Ability to identify and develop effective coping behaviors will improve, and Ability to identify triggers associated with substance abuse/mental health issues will improve  I certify that inpatient services furnished can reasonably be expected to improve the patient's condition.    Laveda Abbe, NP 10/12/20224:21 PM

## 2021-05-24 DIAGNOSIS — F29 Unspecified psychosis not due to a substance or known physiological condition: Secondary | ICD-10-CM | POA: Diagnosis not present

## 2021-05-24 MED ORDER — WHITE PETROLATUM EX OINT
TOPICAL_OINTMENT | CUTANEOUS | Status: AC
  Administered 2021-05-24: 1
  Filled 2021-05-24: qty 15

## 2021-05-24 MED ORDER — ONDANSETRON 4 MG PO TBDP: 4.0000 mg | ORAL_TABLET | Freq: Four times a day (QID) | ORAL | Status: AC | PRN

## 2021-05-24 MED ORDER — LORAZEPAM 1 MG PO TABS: 1.0000 mg | ORAL_TABLET | Freq: Four times a day (QID) | ORAL | Status: DC | PRN

## 2021-05-24 MED ORDER — LOPERAMIDE HCL 2 MG PO CAPS: 2.0000 mg | ORAL_CAPSULE | ORAL | Status: AC | PRN

## 2021-05-24 MED ORDER — GABAPENTIN 100 MG PO CAPS
100.0000 mg | ORAL_CAPSULE | Freq: Three times a day (TID) | ORAL | Status: DC
  Administered 2021-05-24 – 2021-05-28 (×13): 100 mg via ORAL
  Filled 2021-05-24 (×15): qty 1

## 2021-05-24 NOTE — [facility] Group Notes (Signed)
Adult Psychoeducational Group Note  Date:  05/24/2021 Time:  5:24 PM  Group Topic/Focus:  Crisis Planning:   The purpose of this group is to help patients create a crisis plan for use upon discharge or in the future, as needed.  Participation Level:  Did Not Attend   Ryan Stark 05/24/2021, 5:24 PM

## 2021-05-24 NOTE — [facility] Group Notes (Signed)
Adult Psychoeducational Group Note  Date:  05/24/2021 Time:  8:59 AM  Group Topic/Focus:  Goals Group:   The focus of this group is to help patients establish daily goals to achieve during treatment and discuss how the patient can incorporate goal setting into their daily lives to aide in recovery.  Participation Level:  Did Not Attend   Donell Beers 05/24/2021, 8:59 AM

## 2021-05-24 NOTE — Progress Notes (Signed)
Pt presents remains guarded, minimal but brightens up and forwards on interactions. Eye contact is fair but brief with logical and soft speech. Denies SI, HI, AVH and pain when assessed. Rates his anxiety, depression and hopelessness all 0/10. Pt currently denies active withdrawal symptoms. Per pt "I did not drink 3 days before I fell. I had just lest [facility] detox center then". Reports he has not had etoh for a total of about 10-12 days "I just want to go home now". CIWA scores this shift were 0. Pt remains medication compliant without adverse drug reactions. Tolerates all meals and fluids well. Support, encouragement and reassurance provided to pt. Writer informed pt of suture removal by tomorrow per assigned provider and he's in agreement. All medications given with verbal education and effects monitored. Q 15 minutes safety checks effective without incident to note thus far.

## 2021-05-24 NOTE — [facility] Suicide Risk Assessment (Signed)
[name] INPATIENT:  Family/Significant Other Suicide Prevention Education  Suicide Prevention Education:  Education Completed; Father, Ashok Cordia [phone]), has been identified by the patient as the family member/significant other with whom the patient will be residing, and identified as the person(s) who will aid the patient in the event of a mental health crisis (suicidal ideations/suicide attempt).  With written consent from the patient, the family member/significant other has been provided the following suicide prevention education, prior to the and/or following the discharge of the patient.  The suicide prevention education provided includes the following: Suicide risk factors Suicide prevention and interventions National Suicide Hotline telephone number [facility] assessment telephone number [facility] Emergency Assistance [address] [facility] and/or Residential Mobile Crisis Unit telephone number  Request made of family/significant other to: Remove weapons (e.g., guns, rifles, knives), all items previously/currently identified as safety concern.   Remove drugs/medications (over-the-counter, prescriptions, illicit drugs), all items previously/currently identified as a safety concern.  The family member/significant other verbalizes understanding of the suicide prevention education information provided.  The family member/significant other agrees to remove the items of safety concern listed above.  Ryan Stark 05/24/2021, 3:21 PM

## 2021-05-24 NOTE — Group Note (Signed)
Occupational Therapy Group Note  Group Topic:Feelings Management  Group Date: 05/24/2021 Start Time: 1400 End Time: 1445 Facilitators: Dovie Kapusta, OT/L    Group Description:Group encouraged increased engagement and participation through discussion focused on Building Happiness. Patients were provided a handout and reviewed therapeutic strategies to build happiness including identifying gratitudes, random acts of kindness, exercise, meditation, positive journaling, and fostering relationships. Patients engaged in discussion and encouraged to reflect on each strategy and their experiences.  Therapeutic Goal(s): Identify strategies to build happiness. Identify and implement therapeutic strategies to improve overall mood. Practice and identify gratitudes, random acts of kindness, exercise, meditation, positive journaling, and fostering relationships     Participation Level: Did not attend   Plan: Continue to engage patient in OT groups 2 - 3x/week.  05/24/2021  Fannie Gathright, OT/L 

## 2021-05-24 NOTE — Group Note (Signed)
Recreation Therapy Group Note   Group Topic:Coping Skills  Group Date: 05/24/2021 Start Time: 0945 End Time: 1015 Facilitators: Caroll Rancher, LRT/CTRS Location: [address] Dayroom  Goal Area(s) Addresses: Patient will define what a coping skill is. Patient will work with peer to create a list of healthy coping skills beginning with each letter of the alphabet. Patient will successfully identify positive coping skills they can use post d/c.  Patient will acknowledge benefit(s) of using learned coping skills post d/c.  Group Description:  Coping A to Z. Patient asked to identify what a coping skill is and when they use them. Patients with Clinical research associate discussed healthy versus unhealthy coping skills. Next patients were given a blank worksheet titled "Coping Skills A-Z". Partners were instructed to come up with at least one positive coping skill per letter of the alphabet, addressing a specific challenge (ex: stress, anger, anxiety, depression, grief, doubt, isolation, self-harm/suicidal thoughts, substance use). Patients were given 15 minutes to brainstorm, before ideas were presented to the large group. Patients and LRT debriefed on the importance of coping skill selection based on situation and back-up plans when a skill tried is not effective.    Affect/Mood: Blunted   Participation Level: Minimal   Participation Quality: Independent   Behavior: Reserved   Speech/Thought Process: Relevant   Insight: Fair   Judgement: Fair    Modes of Intervention: Worksheet   Patient Response to Interventions:  Attentive   Education Outcome:  Acknowledges education and In group clarification offered    Clinical Observations/Individualized Feedback: Pt was attentive with little interaction.  Pt did ask a question pertaining to coping skills and self defense.  Pt was to come up with coping skills for anxiety.  Pt was called out group to meet with doctor and did not return.    Plan: Continue to  engage patient in RT group sessions 2-3x/week.   Caroll Rancher, LRT/CTRS 05/24/2021 11:23 AM
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
## 2021-05-24 NOTE — Progress Notes (Signed)
   05/23/21 2145  Psych Admission Type (Psych Patients Only)  Admission Status Involuntary  Psychosocial Assessment  Patient Complaints None  Eye Contact Fair  Facial Expression Anxious;Pensive;Worried  Affect Anxious  Speech Logical/coherent  Interaction Cautious;Forwards little;Guarded;Minimal  Motor Activity Restless  Appearance/Hygiene In scrubs  Behavior Characteristics Cooperative;Calm  Mood Pleasant  Aggressive Behavior  Effect No apparent injury  Thought Process  Coherency Concrete thinking  Content Blaming others  Delusions Controlled  Perception WDL  Hallucination None reported or observed  Judgment Poor  Confusion None  Danger to Self  Current suicidal ideation? Denies  Danger to Others  Danger to Others None reported or observed

## 2021-05-24 NOTE — [facility] Group Notes (Signed)
Pt didn't attend wrap up group. 

## 2021-05-25 DIAGNOSIS — F29 Unspecified psychosis not due to a substance or known physiological condition: Secondary | ICD-10-CM | POA: Diagnosis not present

## 2021-05-25 NOTE — Group Note (Signed)
Recreation Therapy Group Note   Group Topic:Team Building  Group Date: 05/25/2021 Start Time: 0955 End Time: 1025 Facilitators: Caroll Rancher, LRT/CTRS Location: [address] Dayroom  Goal Area(s) Addresses:  Patient will effectively work with peer towards shared goal.  Patient will identify skills used to make activity successful.  Patient will identify how skills used during activity can be applied to reach post d/c goals.   Group Description:  [employer]. In teams of 2-3, patients were given 25 small craft pipe cleaners. Using the materials provided, patients were instructed to compete againt the opposing team(s) to build the tallest free-standing structure from floor level. The activity was timed; difficulty increased by Clinical research associate as Production designer, theatre/television/film continued.  Systematically resources were removed with additional directions for example, placing one arm behind their back and working in silence.  LRT facilitated post-activity discussion reviewing team processes and necessary communication skills involved in completion. Patients were encouraged to reflect how the skills utilized, or not utilized, in this activity can be incorporated to positively impact support systems post discharge.   Affect/Mood: Appropriate   Participation Level: Minimal   Participation Quality: Independent   Behavior: Apprehensive    Speech/Thought Process: Focused   Insight: Fair   Judgement: Fair    Modes of Intervention: Team-building   Patient Response to Interventions:  Attentive   Education Outcome:  Acknowledges education and In group clarification offered    Clinical Observations/Individualized Feedback: Pt seemed a little hesitant to join in with the activity.  Pt became more engaged as group went on.  Pt seemed to be working well with peers.  There were moments when pt would sit back and not assist peers.  Pt was pulled from group prior to processing.     Plan: Continue to  engage patient in RT group sessions 2-3x/week.   Caroll Rancher, LRT/CTRS 05/25/2021 11:27 AM

## 2021-05-25 NOTE — [facility] Group Notes (Signed)
Pt didn't attend group. 

## 2021-05-25 NOTE — [facility] Group Notes (Signed)
Adult Psychoeducational Group Note  Date:  05/25/2021 Time:  9:30 AM  Group Topic/Focus:  Goals Group:   The focus of this group is to help patients establish daily goals to achieve during treatment and discuss how the patient can incorporate goal setting into their daily lives to aide in recovery.  Participation Level:  Did Not Attend    Donell Beers 05/25/2021, 9:30 AM

## 2021-05-25 NOTE — Progress Notes (Addendum)
Pt observed with verbal outburst X1 earlier this shift related to d/c demands "I need to leave, I can't be here anymore. Tell the doctor to even send me to [place] right now" while pacing milieu in a agitated state. Declined PRN Vistaril when offered "I don't want that, take your medicine, I want to go home". Pt noted to be calmer when reassessed after meeting with his assigned psychiatrist. Denies SI, HI and AVH this shift. Received PRN Tylenol for c/o right ear pain twice this shift and reported relief. Support, encouragement and reassurance provided to pt. All medications given with verbal education and effects monitored. Repeat EKG done and file in chart. Q 15 minutes safety checks maintained. Pt tolerates all meals and medications well. Remains safe on unit.

## 2021-05-25 NOTE — Progress Notes (Addendum)
[facility] MD Progress Note  05/25/2021 6:28 PM Ryan Stark  MRN:  [id number] Subjective:  Patient is a 35 year old male who was admitted to [facility] for stabilization and medication management after a 3 day hospitalization for alcohol withdrawal seizure.  Chart Review, 24 hr Events: The patient's chart was reviewed and nursing notes were reviewed. The patient's case was discussed in multidisciplinary team meeting.  Per MAR: - Patient is compliant with scheduled meds. - PRNs: tylenol, hydroxyzine, trazodone Per RN notes, no documented behavioral issues and is attending group. Patient slept, n/a number of hours  TODAY'S INTERVIEW Patient seen and assessed with attending Dr. Mason Jim.  Patient states that he is sleeping well and eating well.  Patient states his mood is good.  Patient states that he is doing well and has no physical complaints at this time.  Encourage patient to continue Risperdal and have this adjusted as an outpatient.  Patient verbalized agreement at this time.  Discussed with patient safety planning and patient identified father as most likely candidate to discuss patient's baseline.  Patient does not want team to speak with his ex-girlfriend as he does not plan to go back there and he suspects that she has mental health problems would not be truthful regarding patient's baseline.  Patient denies present SI/HI/AVH, paranoia, first rank symptoms, thought insertion/extraction, thought broadcasting.  Patient denies having any physical complaints at this time.  Patient sleeping well, eating well, hydrating appropriately, and using the restroom appropriately.  Principal Problem: Psychosis, unspecified psychosis type (HCC) Diagnosis: Principal Problem:   Psychosis, unspecified psychosis type (HCC) Active Problems:   ETOH abuse   Seizure (HCC)   Chronic alcohol use   Suicidal ideation   Substance induced mood disorder (HCC)  Total Time spent with patient:  I personally spent 35  minutes on the unit in direct patient care. The direct patient care time included face-to-face time with the patient, reviewing the patient's chart, communicating with other professionals, and coordinating care. Greater than 50% of this time was spent in counseling or coordinating care with the patient regarding goals of hospitalization, psycho-education, and discharge planning needs.   Past Psychiatric History: see H&P  Past Medical History:  Past Medical History:  Diagnosis Date   Alcohol abuse    Cervical spine fracture (HCC) 08/19/2016    Past Surgical History:  Procedure Laterality Date   ANTERIOR CERVICAL DECOMP/DISCECTOMY FUSION N/A 08/20/2016   Procedure: ANTERIOR CERVICAL DECOMPRESSION/DISCECTOMY FUSION CERVICAL FOUR - CERVICAL FIVE;  Surgeon: Shirlean Kelly, MD;  Location: [facility] OR;  Service: Neurosurgery;  Laterality: N/A;  ANTERIOR CERVICAL DECOMPRESSION/DISCECTOMY FUSION CERVICAL FOUR - CERVICAL FIVE   Family History: History reviewed. No pertinent family history. Family Psychiatric  History: see H&P Social History:  Social History   Substance and Sexual Activity  Alcohol Use Yes   Comment: Heavy. Binge drinking     Social History   Substance and Sexual Activity  Drug Use Not Currently   Types: Marijuana   Comment: denies current use    Social History   Socioeconomic History   Marital status: Single    Spouse name: Not on file   Number of children: Not on file   Years of education: Not on file   Highest education level: Not on file  Occupational History   Not on file  Tobacco Use   Smoking status: Every Day    Types: Cigarettes   Smokeless tobacco: Never  Vaping Use   Vaping Use: Never used  Substance and Sexual  Activity   Alcohol use: Yes    Comment: Heavy. Binge drinking   Drug use: Not Currently    Types: Marijuana    Comment: denies current use   Sexual activity: Not on file  Other Topics Concern   Not on file  Social History Narrative   Not on  file   Social Determinants of Health   Financial Resource Strain: Low Risk    Difficulty of Paying Living Expenses: Not hard at all  Food Insecurity: No Food Insecurity   Worried About Programme researcher, broadcasting/film/video in the Last Year: Never true   Ran Out of Food in the Last Year: Never true  Transportation Needs: No Transportation Needs   Lack of Transportation (Medical): No   Lack of Transportation (Non-Medical): No  Physical Activity: Inactive   Days of Exercise per Week: 0 days   Minutes of Exercise per Session: 0 min  Stress: Stress Concern Present   Feeling of Stress : To some extent  Social Connections: Moderately Isolated   Frequency of Communication with Friends and Family: More than three times a week   Frequency of Social Gatherings with Friends and Family: Once a week   Attends Religious Services: Never   Database administrator or Organizations: No   Attends Engineer, structural: Never   Marital Status: Living with partner    Sleep: Good per patient self report  Appetite:  Fair  Current Medications: Current Facility-Administered Medications  Medication Dose Route Frequency Provider Last Rate Last Admin   acetaminophen (TYLENOL) tablet 650 mg  650 mg Oral Q6H PRN Laveda Abbe, NP   650 mg at 05/25/21 1615   alum & mag hydroxide-simeth (MAALOX/MYLANTA) 200-200-20 MG/5ML suspension 30 mL  30 mL Oral Q4H PRN Laveda Abbe, NP       feeding supplement (ENSURE ENLIVE / ENSURE PLUS) liquid 237 mL  237 mL Oral BID BM Laveda Abbe, NP   237 mL at 05/25/21 1617   folic acid (FOLVITE) tablet 1 mg  1 mg Oral Daily Laveda Abbe, NP   1 mg at 05/25/21 8546   gabapentin (NEURONTIN) capsule 100 mg  100 mg Oral TID Park Pope, MD   100 mg at 05/25/21 1618   hydrOXYzine (ATARAX/VISTARIL) tablet 25 mg  25 mg Oral TID PRN Laveda Abbe, NP   25 mg at 05/24/21 2046   loperamide (IMODIUM) capsule 2-4 mg  2-4 mg Oral PRN Comer Locket, MD        OLANZapine zydis (ZYPREXA) disintegrating tablet 10 mg  10 mg Oral Q8H PRN Laveda Abbe, NP       And   LORazepam (ATIVAN) tablet 1 mg  1 mg Oral PRN Laveda Abbe, NP       And   ziprasidone (GEODON) injection 20 mg  20 mg Intramuscular PRN Laveda Abbe, NP       LORazepam (ATIVAN) tablet 1 mg  1 mg Oral Q6H PRN Mason Jim, Kamisha Ell E, MD       magnesium hydroxide (MILK OF MAGNESIA) suspension 30 mL  30 mL Oral Daily PRN Laveda Abbe, NP       multivitamin with minerals tablet 1 tablet  1 tablet Oral Daily Laveda Abbe, NP   1 tablet at 05/25/21 0817   nicotine (NICODERM CQ - dosed in mg/24 hours) patch 14 mg  14 mg Transdermal Daily Mariel Craft, MD   14 mg at 05/25/21 [phone]  ondansetron (ZOFRAN-ODT) disintegrating tablet 4 mg  4 mg Oral Q6H PRN Mason Jim, Myda Detwiler E, MD       risperiDONE (RISPERDAL) tablet 1 mg  1 mg Oral QHS Laveda Abbe, NP   1 mg at 05/24/21 2046   traZODone (DESYREL) tablet 50 mg  50 mg Oral QHS PRN Laveda Abbe, NP   50 mg at 05/24/21 2046    Lab Results:  No results found for this or any previous visit (from the past 48 hour(s)).   Blood Alcohol level:  Lab Results  Component Value Date   ETH <10 05/19/2021   ETH 363 (HH) 05/31/2018    Metabolic Disorder Labs: Lab Results  Component Value Date   HGBA1C 5.6 05/22/2021   MPG 114.02 05/22/2021   Lab Results  Component Value Date   PROLACTIN 9.3 05/21/2021   PROLACTIN 36.7 (H) 05/20/2021   Lab Results  Component Value Date   CHOL 171 05/22/2021   TRIG 81 05/22/2021   HDL 53 05/22/2021   CHOLHDL 3.2 05/22/2021   VLDL 16 05/22/2021   LDLCALC 102 (H) 05/22/2021    Physical Findings:   Musculoskeletal: Strength & Muscle Tone: within normal limits Gait & Station: normal Patient leans: N/A  Psychiatric Specialty Exam:  Presentation  General Appearance: Appropriate for Environment  Eye Contact:Fair  Speech:Clear and Coherent; Normal  Rate  Speech Volume:Normal  Handedness:Right   Mood and Affect  Mood:described as "good" - less defensive today  Affect: calm, polite, brighter   Thought Process  Thought Processes:Coherent; Goal Directed  Descriptions of Associations:Intact  Orientation:Full (Time, Place and Person)  Thought Content: Logical - denies AVH, paranoia, ideas of reference, first rank symptoms; is not grossly responding to internal/external stimuli on exam  History of Schizophrenia/Schizoaffective disorder:No  Duration of Psychotic Symptoms:No data recorded Hallucinations:Hallucinations: None  Ideas of Reference:None  Suicidal Thoughts:Suicidal Thoughts: No  Homicidal Thoughts:Homicidal Thoughts: No   Sensorium  Memory:Immediate Good; Recent Good; Remote Good  Judgment:Fair  Insight:Fair   Executive Functions  Concentration:Good  Attention Span:Good  Recall:Good  Fund of Knowledge:Good  Language:Good   Psychomotor Activity  Psychomotor Activity:Psychomotor Activity: Normal   Assets  Assets:Communication Skills; Desire for Improvement; Housing; Social Support   Sleep  Sleep: 6.75 hours   Physical Exam Vitals and nursing note reviewed.  Constitutional:      Appearance: Normal appearance. He is normal weight.  HENT:     Head: Normocephalic and atraumatic.  Pulmonary:     Effort: Pulmonary effort is normal.  Neurological:     General: No focal deficit present.     Mental Status: He is oriented to person, place, and time.   Review of Systems  Respiratory:  Negative for shortness of breath.   Cardiovascular:  Negative for chest pain.  Gastrointestinal:  Negative for abdominal pain, constipation, diarrhea, heartburn, nausea and vomiting.  Neurological:  Negative for headaches.  Blood pressure 126/86, pulse 99, temperature 98.1 F (36.7 C), temperature source Oral, resp. rate 16, height 5\' 9"  (1.753 m), weight 59.9 kg, SpO2 99 %. Body mass index is 19.49  kg/m.   Treatment Plan Summary: Daily contact with patient to assess and evaluate symptoms and progress in treatment and Medication management  ASSESSMENT Patient is a 35 year old male who was admitted to [facility] for stabilization and medication management after a 3 day hospitalization for alcohol withdrawal seizure.  Patient does not appear to be responding to internal stimuli during assessment today.  While patient is currently compliant with  medications while on the unit patient appears to be noncompliant in the past as an outpatient.  Patient denies paranoia, thought broadcasting, hallucinations.  Per Dr. Betsey Holiday note on 05/22/2021, patient had been endorsing the symptoms so it appears that the Risperdal he is on is effective in managing his psychosis.  Patient denies present SI/HI/AVH.  Attempted to call father today but no one answered.  Will reattempt prior to discharge.  Plan for discharge Monday should patient continue to improve mood.  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 (r/o EtOH induced psychosis) -Continue Risperdal 1 mg qd -Agitation protocol: zydis 10 mg, ativan 1 mg, geodon 20 mg - Will attempt to get collateral to see if patient is nearing clinical baseline   Alcohol Use Disorder-Severe with likely alcohol withdrawal seizure -Continue Gabapentin 100 tid  - Continue multivitamin and folvite for nutritional support - CIWA protocol for withdrawal monitoring (recent scores 0) - Per hosptialist discharge note, loaded with Keppra but no need to continue at this time  Nicotine Use Disorder -Nicoderm 14 mg for NRT  Medical Problems Nutritional Support -Ensure BID  Chin sutures -- Dr. Renaldo Fiddler and this writer successfully removed 5 blue sutures from patient's lower chin on 10/14  PRNs Tylenol for pain Maalox for indigestion Hydroxyzine 3 times daily for anxiety Milk of magnesia for mild constipation Trazodone for  insomnia  3. Safety and Monitoring: Involuntary admission to inpatient psychiatric unit for safety, stabilization and treatment Daily contact with patient to assess and evaluate symptoms and progress in treatment Patient's case to be discussed in multi-disciplinary team meeting Observation Level : q15 minute checks Vital signs: q12 hours Precautions: suicide, elopement, and assault   4. Discharge Planning: Social work and case management to assist with discharge planning and identification of hospital follow-up needs prior to discharge Estimated LOS: 5-7 days Discharge Concerns: Need to establish a safety plan; Medication compliance and effectiveness Discharge Goals: Return home with outpatient referrals for mental health follow-up including medication management/psychotherapy  Park Pope, MD 05/25/2021, 6:28 PM

## 2021-05-25 NOTE — Group Note (Signed)
LCSW Group Therapy Note   Group Date: 05/25/2021 Start Time: 1100 End Time: 1200   TType of Therapy:  Group Therapy: Gratitude   Participation Level:  Active  Summary of Progress/Problems: Pt received the packet for group. 

## 2021-05-25 NOTE — Progress Notes (Signed)
DAR NOTE: Patient presents with calm affect and pleasant mood.  Denies pain, auditory and visual hallucinations.  Maintained on routine safety checks.  Medications given as prescribed.  Support and encouragement offered as needed.  Will continue to monitor. 

## 2021-05-25 NOTE — Plan of Care (Addendum)
Spoke with Dr. Royann Shivers regarding patient's abnormal ECG findings. Cardiology states ECG findings have been consistent with early repolarization and are not concerning for pericarditis.  Park Pope, MD PGY1 Psychiatry Resident

## 2021-05-25 NOTE — [facility] Group Notes (Signed)
Adult Psychoeducational Group Note  Date:  05/25/2021 Time:  4:25 PM  Group Topic/Focus:  Recovery Goals:   The focus of this group is to identify appropriate goals for recovery and establish a plan to achieve them.  Participation Level:  Did Not Attend    Donell Beers 05/25/2021, 4:25 PM

## 2021-05-25 NOTE — Plan of Care (Signed)
Attempted to reach out to father Gearld Kerstein for collateral but he did not answer phone. Will re-attempt at a later time.  Park Pope, MD PGY1 Psychiatry Resident

## 2021-05-26 NOTE — Progress Notes (Signed)
[facility] MD Progress Note  05/26/2021 5:54 PM Bridget Westbrooks  MRN:  [id number] Subjective:  Patient is a 35 year old male who was admitted to [facility] for stabilization and medication management after a 3 day hospitalization for alcohol withdrawal seizure.  Chart Review, 24 hr Events: The patient's chart was reviewed and nursing notes were reviewed. The patient's case was discussed in multidisciplinary team meeting.  Per MAR: - Patient is compliant with scheduled meds. - PRNs: tylenol, hydroxyzine, trazodone Per RN notes, no documented behavioral issues and is attending group. Patient slept, n/a number of hours  TODAY'S INTERVIEW Patient seen and assessed with attending Dr. Mason Jim.  Patient's presentation is largely unchanged from yesterday.  He states that he is sleeping well, eating well, and voiding appropriately.  Patient states his mood is good, and he has no physical complaints at this time he endorses that his incision is healing appropriately without pain nor signs of infection.  Advised patient that another attempt would be made to reach out to his father for safety planning and gauging whether or not patient has returned to his baseline.  Patient verbalized agreement at this time.  He understands that he will be discharging to a shelter, and he is agreeable. Patient denies present SI/HI/AVH, paranoia, first rank symptoms, thought insertion/extraction, thought broadcasting.   Principal Problem: Psychosis, unspecified psychosis type (HCC) Diagnosis: Principal Problem:   Psychosis, unspecified psychosis type (HCC) Active Problems:   ETOH abuse   Seizure (HCC)   Chronic alcohol use   Suicidal ideation   Substance induced mood disorder (HCC)  Total Time spent with patient:  I personally spent 35 minutes on the unit in direct patient care. The direct patient care time included face-to-face time with the patient, reviewing the patient's chart, communicating with other professionals, and  coordinating care. Greater than 50% of this time was spent in counseling or coordinating care with the patient regarding goals of hospitalization, psycho-education, and discharge planning needs.   Past Psychiatric History: see H&P  Past Medical History:  Past Medical History:  Diagnosis Date   Alcohol abuse    Cervical spine fracture (HCC) 08/19/2016    Past Surgical History:  Procedure Laterality Date   ANTERIOR CERVICAL DECOMP/DISCECTOMY FUSION N/A 08/20/2016   Procedure: ANTERIOR CERVICAL DECOMPRESSION/DISCECTOMY FUSION CERVICAL FOUR - CERVICAL FIVE;  Surgeon: Shirlean Kelly, MD;  Location: [facility] OR;  Service: Neurosurgery;  Laterality: N/A;  ANTERIOR CERVICAL DECOMPRESSION/DISCECTOMY FUSION CERVICAL FOUR - CERVICAL FIVE   Family History: History reviewed. No pertinent family history. Family Psychiatric  History: see H&P Social History:  Social History   Substance and Sexual Activity  Alcohol Use Yes   Comment: Heavy. Binge drinking     Social History   Substance and Sexual Activity  Drug Use Not Currently   Types: Marijuana   Comment: denies current use    Social History   Socioeconomic History   Marital status: Single    Spouse name: Not on file   Number of children: Not on file   Years of education: Not on file   Highest education level: Not on file  Occupational History   Not on file  Tobacco Use   Smoking status: Every Day    Types: Cigarettes   Smokeless tobacco: Never  Vaping Use   Vaping Use: Never used  Substance and Sexual Activity   Alcohol use: Yes    Comment: Heavy. Binge drinking   Drug use: Not Currently    Types: Marijuana    Comment: denies  current use   Sexual activity: Not on file  Other Topics Concern   Not on file  Social History Narrative   Not on file   Social Determinants of Health   Financial Resource Strain: Low Risk    Difficulty of Paying Living Expenses: Not hard at all  Food Insecurity: No Food Insecurity   Worried About  Running Out of Food in the Last Year: Never true   Ran Out of Food in the Last Year: Never true  Transportation Needs: No Transportation Needs   Lack of Transportation (Medical): No   Lack of Transportation (Non-Medical): No  Physical Activity: Inactive   Days of Exercise per Week: 0 days   Minutes of Exercise per Session: 0 min  Stress: Stress Concern Present   Feeling of Stress : To some extent  Social Connections: Moderately Isolated   Frequency of Communication with Friends and Family: More than three times a week   Frequency of Social Gatherings with Friends and Family: Once a week   Attends Religious Services: Never   Database administrator or Organizations: No   Attends Engineer, structural: Never   Marital Status: Living with partner    Sleep: Good per patient self report  Appetite:  Fair  Current Medications: Current Facility-Administered Medications  Medication Dose Route Frequency Provider Last Rate Last Admin   acetaminophen (TYLENOL) tablet 650 mg  650 mg Oral Q6H PRN Laveda Abbe, NP   650 mg at 05/26/21 1004   alum & mag hydroxide-simeth (MAALOX/MYLANTA) 200-200-20 MG/5ML suspension 30 mL  30 mL Oral Q4H PRN Laveda Abbe, NP       feeding supplement (ENSURE ENLIVE / ENSURE PLUS) liquid 237 mL  237 mL Oral BID BM Laveda Abbe, NP   237 mL at 05/26/21 1006   folic acid (FOLVITE) tablet 1 mg  1 mg Oral Daily Laveda Abbe, NP   1 mg at 05/26/21 1004   gabapentin (NEURONTIN) capsule 100 mg  100 mg Oral TID Park Pope, MD   100 mg at 05/26/21 1307   hydrOXYzine (ATARAX/VISTARIL) tablet 25 mg  25 mg Oral TID PRN Laveda Abbe, NP   25 mg at 05/26/21 1307   loperamide (IMODIUM) capsule 2-4 mg  2-4 mg Oral PRN Comer Locket, MD       OLANZapine zydis (ZYPREXA) disintegrating tablet 10 mg  10 mg Oral Q8H PRN Laveda Abbe, NP   10 mg at 05/26/21 1307   And   LORazepam (ATIVAN) tablet 1 mg  1 mg Oral PRN Laveda Abbe, NP       And   ziprasidone (GEODON) injection 20 mg  20 mg Intramuscular PRN Laveda Abbe, NP       LORazepam (ATIVAN) tablet 1 mg  1 mg Oral Q6H PRN Mason Jim, Amy E, MD       magnesium hydroxide (MILK OF MAGNESIA) suspension 30 mL  30 mL Oral Daily PRN Laveda Abbe, NP       multivitamin with minerals tablet 1 tablet  1 tablet Oral Daily Laveda Abbe, NP   1 tablet at 05/26/21 1004   nicotine (NICODERM CQ - dosed in mg/24 hours) patch 14 mg  14 mg Transdermal Daily Mariel Craft, MD   14 mg at 05/26/21 1007   ondansetron (ZOFRAN-ODT) disintegrating tablet 4 mg  4 mg Oral Q6H PRN Comer Locket, MD       risperiDONE (RISPERDAL) tablet  1 mg  1 mg Oral QHS Laveda Abbe, NP   1 mg at 05/25/21 2049   traZODone (DESYREL) tablet 50 mg  50 mg Oral QHS PRN Laveda Abbe, NP   50 mg at 05/25/21 2049    Lab Results:  No results found for this or any previous visit (from the past 48 hour(s)).   Blood Alcohol level:  Lab Results  Component Value Date   ETH <10 05/19/2021   ETH 363 (HH) 05/31/2018    Metabolic Disorder Labs: Lab Results  Component Value Date   HGBA1C 5.6 05/22/2021   MPG 114.02 05/22/2021   Lab Results  Component Value Date   PROLACTIN 9.3 05/21/2021   PROLACTIN 36.7 (H) 05/20/2021   Lab Results  Component Value Date   CHOL 171 05/22/2021   TRIG 81 05/22/2021   HDL 53 05/22/2021   CHOLHDL 3.2 05/22/2021   VLDL 16 05/22/2021   LDLCALC 102 (H) 05/22/2021    Physical Findings:   Musculoskeletal: Strength & Muscle Tone: within normal limits Gait & Station: normal Patient leans: N/A  Psychiatric Specialty Exam:  Presentation  General Appearance: Appropriate for Environment; Casual  Eye Contact:Good  Speech:Clear and Coherent; Normal Rate  Speech Volume:Normal  Handedness:Right   Mood and Affect  Mood:described as "good" - less defensive today  Affect: calm, polite,  brighter   Thought Process  Thought Processes:Coherent; Goal Directed; Linear  Descriptions of Associations:Intact  Orientation:Full (Time, Place and Person)  Thought Content: Logical - denies AVH, paranoia, ideas of reference, first rank symptoms; is not grossly responding to internal/external stimuli on exam  History of Schizophrenia/Schizoaffective disorder:No  Hallucinations:Hallucinations: None  Ideas of Reference:None  Suicidal Thoughts:Suicidal Thoughts: No   Homicidal Thoughts:Homicidal Thoughts: No    Sensorium  Memory:Immediate Good; Recent Good; Remote Good  Judgment:Good  Insight:Good   Executive Functions  Concentration:Good  Attention Span:Good  Recall:Good  Fund of Knowledge:Good  Language:Good   Psychomotor Activity  Psychomotor Activity:Psychomotor Activity: Normal   Assets  Assets:Communication Skills; Desire for Improvement; Physical Health; Resilience; Social Support   Sleep  Sleep: 6.75 hours   Physical Exam Vitals and nursing note reviewed.  Constitutional:      Appearance: Normal appearance. He is normal weight.  HENT:     Head: Normocephalic and atraumatic.     Comments: Incision healing appropriately, nonerythematous, no signs of purulence, minimal blood clots present in beard Pulmonary:     Effort: Pulmonary effort is normal.  Skin:    General: Skin is warm and dry.  Neurological:     General: No focal deficit present.     Mental Status: He is oriented to person, place, and time.   Review of Systems  HENT:  Positive for ear pain.   Respiratory:  Negative for shortness of breath.   Cardiovascular:  Negative for chest pain.  Gastrointestinal:  Negative for abdominal pain, constipation, diarrhea, heartburn, nausea and vomiting.  Genitourinary: Negative.   Neurological:  Negative for tremors and headaches.  Blood pressure 122/72, pulse 90, temperature 97.9 F (36.6 C), temperature source Oral, resp. rate 16, height 5'  9" (1.753 m), weight 59.9 kg, SpO2 100 %. Body mass index is 19.49 kg/m.   Treatment Plan Summary: Daily contact with patient to assess and evaluate symptoms and progress in treatment and Medication management  ASSESSMENT Patient is a 35 year old male who was admitted to [facility] for stabilization and medication management after a 3 day hospitalization for alcohol withdrawal seizure.  Patient does  not appear to be responding to internal stimuli during assessment today.  While patient is currently compliant with medications while on the unit patient appears to be noncompliant in the past as an outpatient.  Patient denies paranoia, thought broadcasting, hallucinations.  Per Dr. Betsey Holiday note on 05/22/2021, patient had been endorsing the symptoms so it appears that the Risperdal he is on is effective in managing his psychosis.  Patient denies present SI/HI/AVH.   Will reattempt calling father prior to discharge.  Plan for discharge Monday should patient continue to improve mood.  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 (r/o EtOH induced psychosis) -Continue Risperdal 1 mg qd -Agitation protocol: zydis 10 mg, ativan 1 mg, geodon 20 mg - Will attempt to get collateral to see if patient is nearing clinical baseline   Alcohol Use Disorder-Severe with likely alcohol withdrawal seizure -Continue Gabapentin 100 tid  - Continue multivitamin and folvite for nutritional support - CIWA protocol for withdrawal monitoring (recent scores 0) - Per hosptialist discharge note, loaded with Keppra but no need to continue at this time  Nicotine Use Disorder -Nicoderm 14 mg for NRT  Medical Problems Nutritional Support -Ensure BID  Chin sutures -- Dr. Renaldo Fiddler and this writer successfully removed 5 blue sutures from patient's lower chin on 10/14  PRNs Tylenol for pain Maalox for indigestion Hydroxyzine 3 times daily for anxiety Milk of magnesia for mild  constipation Trazodone for insomnia  3. Safety and Monitoring: Involuntary admission to inpatient psychiatric unit for safety, stabilization and treatment Daily contact with patient to assess and evaluate symptoms and progress in treatment Patient's case to be discussed in multi-disciplinary team meeting Observation Level : q15 minute checks Vital signs: q12 hours Precautions: suicide, elopement, and assault   4. Discharge Planning: Social work and case management to assist with discharge planning and identification of hospital follow-up needs prior to discharge Estimated LOS: 5-7 days Discharge Concerns: Need to establish a safety plan; Medication compliance and effectiveness Discharge Goals: Return home with outpatient referrals for mental health follow-up including medication management/psychotherapy  Lamar Sprinkles, MD 05/26/2021, 5:54 PM

## 2021-05-26 NOTE — Progress Notes (Signed)
Patient was isolative to his room on shift. He had minimal interaction with peers and staff. He was compliant with medications. He denies SI & SVH. He is currently resting in bed eyes closed at this time.

## 2021-05-26 NOTE — Progress Notes (Signed)
Pt received PRN Zydis and Vistaril (see emar) at 1307 for anxiety and agitation. Stormed out of group with verbal outburst ([place] & native language) later in [place], demanding d/c, went into his room and shut the door. Observed pacing in hall briefly and still tangential and irritable about the incident. Per pt "I felt disrespected, even the university student respect me" when asked about events leading to his outburst. Pt later reported that a male staff saw him on the commode naked, trying to talk to him "It made me mad". Pt also received PRN Tylenol twice this shift for right ear pain and reported relief when reassessed. Safety checks maintained at Q 15 minutes intervals without self harm gestures. Support and reassurance offered to pt. Writer encouraged pt to voice concerns. All medications given with verbal education and effects monitored.  Pt safe on and off unit. Denies concerns at this time. Pt noted asleep when reassessed at 1405 and was more redirectable and pleasant later this evening. Stayed in room most of this evening.

## 2021-05-26 NOTE — Progress Notes (Signed)
Pt did not attend therapeutic relaxation group. 

## 2021-05-26 NOTE — [facility] Group Notes (Signed)
Patient did not attend group.

## 2021-05-26 NOTE — Progress Notes (Signed)
Pt did not attend orientation/goals group. 
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
## 2021-05-26 NOTE — Progress Notes (Signed)
Pt did not attend psycho-ed group. 

## 2021-05-27 MED ORDER — ONDANSETRON 4 MG PO TBDP: 4.0000 mg | ORAL_TABLET | Freq: Four times a day (QID) | ORAL | Status: DC | PRN

## 2021-05-27 MED ORDER — LOPERAMIDE HCL 2 MG PO CAPS: 2.0000 mg | ORAL_CAPSULE | ORAL | Status: DC | PRN

## 2021-05-27 NOTE — Progress Notes (Signed)
Pt did not attend therapeutic relaxation group. 

## 2021-05-27 NOTE — Plan of Care (Signed)
Patient continues to express worries and concerns related to housing. Mostly in room.
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
## 2021-05-27 NOTE — [facility] Group Notes (Signed)
Patient did not attend group.

## 2021-05-27 NOTE — Progress Notes (Signed)
Pt did not attend psycho-ed group. 

## 2021-05-27 NOTE — Group Note (Signed)
  [name] LCSW Group Therapy Note  Date/Time:  05/27/2021 10:00AM-11:00AM  Type of Therapy and Topic:  Group Therapy:  Self-Care Wheel  Participation Level:  Did Not Attend   Description of Group This process group involved patients discussing the importance of self-care in different areas of life (professional, personal, emotional, psychological, spiritual, and physical) in order to achieve healthy life balance.  The group talked about what self-care in each of those areas would constitute and then specifically listed how they want to provide themselves with improved self-care.  Therapeutic Goals Patient will learn how to break self-care down into various areas of life Patient will participate in generating ideas about healthy self-care options in each category Patients will be supportive of one another and receive support from others Patient will identify one healthy self-care activity to add to his/her life   Summary of Patient Progress:  The patient was invited to group, did not attend.  Therapeutic Modalities Processing Psychoeducation   Ambrose Mantle, LCSW 05/27/2021 3:54 PM

## 2021-05-27 NOTE — Group Note (Signed)
Late Note for 05/26/2021  Clinical Social Work Note  No group could be held this day due to low staffing.  Other licensed group was held.  Vivianne Carles Grossman-Orr, LCSW 05/26/2021    

## 2021-05-27 NOTE — Progress Notes (Signed)
Patient mentioned to Clinical research associate that he is currently homeless and unemployed. He tried to contact his father to ask if he can return home but father is not picking up.

## 2021-05-27 NOTE — Progress Notes (Addendum)
[facility] MD Progress Note  05/27/2021 7:12 AM Ryan Stark  MRN:  [id number] Subjective:  Patient is a 35 year old male who was admitted to [facility] for stabilization and medication management after a 3 day hospitalization for alcohol withdrawal seizure.  Chart Review, 24 hr Events: The patient's chart was reviewed and nursing notes were reviewed. The patient's case was discussed in multidisciplinary team meeting.  Per MAR: - Patient is compliant with scheduled meds. - PRNs: tylenol, hydroxyzine, trazodone Per RN notes, attending group but had an angry outburst yesterday after a nurse had walked in on patient using the bathroom. Patient slept, n/a number of hours  TODAY'S INTERVIEW Patient seen and assessed with attending Dr. Mason Jim.  Patient's presentation is largely unchanged from yesterday.  He states that he is sleeping well, eating well, and voiding appropriately.  Patient states his mood is good, and he has no physical complaints at this time he endorses that his incision is healing appropriately without pain nor signs of infection.  Advised patient that another attempt would be made to reach out to his father for safety planning and gauging whether or not patient has returned to his baseline.  Patient verbalized agreement at this time.  He understands that he will be discharging to a shelter, and he is agreeable.  Patient states that he was regretful for the loud outbursts from yesterday.  Patient states that he feels more at peace now and that "reading the Bible helps me".  Patient denies present SI/HI/AVH, paranoia, first rank symptoms, thought insertion/extraction, thought broadcasting.   Principal Problem: Psychosis, unspecified psychosis type (HCC) Diagnosis: Principal Problem:   Psychosis, unspecified psychosis type (HCC) Active Problems:   ETOH abuse   Seizure (HCC)   Chronic alcohol use   Suicidal ideation   Substance induced mood disorder (HCC)  Total Time spent with patient:   I personally spent 35 minutes on the unit in direct patient care. The direct patient care time included face-to-face time with the patient, reviewing the patient's chart, communicating with other professionals, and coordinating care. Greater than 50% of this time was spent in counseling or coordinating care with the patient regarding goals of hospitalization, psycho-education, and discharge planning needs.   Past Psychiatric History: see H&P  Past Medical History:  Past Medical History:  Diagnosis Date   Alcohol abuse    Cervical spine fracture (HCC) 08/19/2016    Past Surgical History:  Procedure Laterality Date   ANTERIOR CERVICAL DECOMP/DISCECTOMY FUSION N/A 08/20/2016   Procedure: ANTERIOR CERVICAL DECOMPRESSION/DISCECTOMY FUSION CERVICAL FOUR - CERVICAL FIVE;  Surgeon: Shirlean Kelly, MD;  Location: [facility] OR;  Service: Neurosurgery;  Laterality: N/A;  ANTERIOR CERVICAL DECOMPRESSION/DISCECTOMY FUSION CERVICAL FOUR - CERVICAL FIVE   Family History: History reviewed. No pertinent family history. Family Psychiatric  History: see H&P Social History:  Social History   Substance and Sexual Activity  Alcohol Use Yes   Comment: Heavy. Binge drinking     Social History   Substance and Sexual Activity  Drug Use Not Currently   Types: Marijuana   Comment: denies current use    Social History   Socioeconomic History   Marital status: Single    Spouse name: Not on file   Number of children: Not on file   Years of education: Not on file   Highest education level: Not on file  Occupational History   Not on file  Tobacco Use   Smoking status: Every Day    Types: Cigarettes   Smokeless tobacco: Never  Vaping Use   Vaping Use: Never used  Substance and Sexual Activity   Alcohol use: Yes    Comment: Heavy. Binge drinking   Drug use: Not Currently    Types: Marijuana    Comment: denies current use   Sexual activity: Not on file  Other Topics Concern   Not on file  Social  History Narrative   Not on file   Social Determinants of Health   Financial Resource Strain: Low Risk    Difficulty of Paying Living Expenses: Not hard at all  Food Insecurity: No Food Insecurity   Worried About Programme researcher, broadcasting/film/video in the Last Year: Never true   Ran Out of Food in the Last Year: Never true  Transportation Needs: No Transportation Needs   Lack of Transportation (Medical): No   Lack of Transportation (Non-Medical): No  Physical Activity: Inactive   Days of Exercise per Week: 0 days   Minutes of Exercise per Session: 0 min  Stress: Stress Concern Present   Feeling of Stress : To some extent  Social Connections: Moderately Isolated   Frequency of Communication with Friends and Family: More than three times a week   Frequency of Social Gatherings with Friends and Family: Once a week   Attends Religious Services: Never   Database administrator or Organizations: No   Attends Engineer, structural: Never   Marital Status: Living with partner    Sleep: Good per patient self report  Appetite:  Fair  Current Medications: Current Facility-Administered Medications  Medication Dose Route Frequency Provider Last Rate Last Admin   acetaminophen (TYLENOL) tablet 650 mg  650 mg Oral Q6H PRN Laveda Abbe, NP   650 mg at 05/26/21 1827   alum & mag hydroxide-simeth (MAALOX/MYLANTA) 200-200-20 MG/5ML suspension 30 mL  30 mL Oral Q4H PRN Laveda Abbe, NP       feeding supplement (ENSURE ENLIVE / ENSURE PLUS) liquid 237 mL  237 mL Oral BID BM Laveda Abbe, NP   237 mL at 05/26/21 1514   folic acid (FOLVITE) tablet 1 mg  1 mg Oral Daily Laveda Abbe, NP   1 mg at 05/26/21 1004   gabapentin (NEURONTIN) capsule 100 mg  100 mg Oral TID Park Pope, MD   100 mg at 05/26/21 1812   hydrOXYzine (ATARAX/VISTARIL) tablet 25 mg  25 mg Oral TID PRN Laveda Abbe, NP   25 mg at 05/26/21 2104   loperamide (IMODIUM) capsule 2-4 mg  2-4 mg Oral PRN  Comer Locket, MD       OLANZapine zydis (ZYPREXA) disintegrating tablet 10 mg  10 mg Oral Q8H PRN Laveda Abbe, NP   10 mg at 05/26/21 1307   And   LORazepam (ATIVAN) tablet 1 mg  1 mg Oral PRN Laveda Abbe, NP       And   ziprasidone (GEODON) injection 20 mg  20 mg Intramuscular PRN Laveda Abbe, NP       magnesium hydroxide (MILK OF MAGNESIA) suspension 30 mL  30 mL Oral Daily PRN Laveda Abbe, NP       multivitamin with minerals tablet 1 tablet  1 tablet Oral Daily Laveda Abbe, NP   1 tablet at 05/26/21 1004   nicotine (NICODERM CQ - dosed in mg/24 hours) patch 14 mg  14 mg Transdermal Daily Mariel Craft, MD   14 mg at 05/26/21 1007   ondansetron (ZOFRAN-ODT) disintegrating tablet 4  mg  4 mg Oral Q6H PRN Bartholomew Crews E, MD       risperiDONE (RISPERDAL) tablet 1 mg  1 mg Oral QHS Laveda Abbe, NP   1 mg at 05/26/21 2104   traZODone (DESYREL) tablet 50 mg  50 mg Oral QHS PRN Laveda Abbe, NP   50 mg at 05/25/21 2049    Lab Results:  No results found for this or any previous visit (from the past 48 hour(s)).   Blood Alcohol level:  Lab Results  Component Value Date   ETH <10 05/19/2021   ETH 363 (HH) 05/31/2018    Metabolic Disorder Labs: Lab Results  Component Value Date   HGBA1C 5.6 05/22/2021   MPG 114.02 05/22/2021   Lab Results  Component Value Date   PROLACTIN 9.3 05/21/2021   PROLACTIN 36.7 (H) 05/20/2021   Lab Results  Component Value Date   CHOL 171 05/22/2021   TRIG 81 05/22/2021   HDL 53 05/22/2021   CHOLHDL 3.2 05/22/2021   VLDL 16 05/22/2021   LDLCALC 102 (H) 05/22/2021    Physical Findings:   Musculoskeletal: Strength & Muscle Tone: within normal limits Gait & Station: normal Patient leans: N/A  Psychiatric Specialty Exam:  Presentation  General Appearance: Appropriate for Environment; Casual  Eye Contact:Good  Speech:Clear and Coherent; Normal Rate  Speech  Volume:Normal  Handedness:Right   Mood and Affect  Mood:described as "good" - appears euthymic  Affect: calm, polite, brighter   Thought Process  Thought Processes:Coherent; Goal Directed; Linear  Descriptions of Associations:Intact  Orientation:Full (Time, Place and Person)  Thought Content: Logical - denies AVH, paranoia, ideas of reference, first rank symptoms; is not grossly responding to internal/external stimuli on exam  History of Schizophrenia/Schizoaffective disorder:No  Hallucinations:Hallucinations: None  Ideas of Reference:None  Suicidal Thoughts:Suicidal Thoughts: No    Homicidal Thoughts:Homicidal Thoughts: No     Sensorium  Memory:Immediate Good; Recent Good; Remote Good  Judgment:Good  Insight:Good   Executive Functions  Concentration:Good  Attention Span:Good  Recall:Good  Fund of Knowledge:Good  Language:Good   Psychomotor Activity  Psychomotor Activity:Psychomotor Activity: Normal   Assets  Assets:Communication Skills; Desire for Improvement; Physical Health; Resilience; Social Support   Sleep  Sleep: time unrecorded   Physical Exam Vitals and nursing note reviewed.  Constitutional:      Appearance: Normal appearance. He is normal weight.  HENT:     Head: Normocephalic and atraumatic.     Comments: Incision healing appropriately, nonerythematous, no signs of purulence, minimal blood clots present in beard Pulmonary:     Effort: Pulmonary effort is normal.  Skin:    General: Skin is warm and dry.  Neurological:     General: No focal deficit present.     Mental Status: He is oriented to person, place, and time.   Review of Systems  HENT:  Positive for ear pain.   Respiratory:  Negative for shortness of breath.   Cardiovascular:  Negative for chest pain.  Gastrointestinal:  Negative for abdominal pain, constipation, diarrhea, heartburn, nausea and vomiting.  Genitourinary: Negative.   Neurological:  Negative for  tremors and headaches.  Blood pressure 118/72, pulse 93, temperature 98 F (36.7 C), temperature source Oral, resp. rate 16, height 5\' 9"  (1.753 m), weight 59.9 kg, SpO2 100 %. Body mass index is 19.49 kg/m.   Treatment Plan Summary: Daily contact with patient to assess and evaluate symptoms and progress in treatment and Medication management  ASSESSMENT Patient is a 35 year old male who  was admitted to [facility] for stabilization and medication management after a 3 day hospitalization for alcohol withdrawal seizure.  Patient does not appear to be responding to internal stimuli during assessment today.  While patient is currently compliant with medications while on the unit patient appears to be noncompliant in the past as an outpatient.  Patient denies paranoia, thought broadcasting, hallucinations.  Per Dr. Betsey Holiday note on 05/22/2021, patient had been endorsing the symptoms so it appears that the Risperdal he is on is effective in managing his psychosis.  Patient denies present SI/HI/AVH.   Will reattempt calling father prior to discharge.  Plan for discharge Monday should patient continue to improve mood.  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 (r/o EtOH induced psychosis) -Continue Risperdal 1 mg qd -Agitation protocol: zydis 10 mg, ativan 1 mg, geodon 20 mg - Will attempt to get collateral to see if patient is nearing clinical baseline   Alcohol Use Disorder-Severe with likely alcohol withdrawal seizure -Continue Gabapentin 100 tid  - Continue multivitamin and folvite for nutritional support - CIWA protocol for withdrawal monitoring (recent scores 0) - Per hosptialist discharge note, loaded with Keppra but no need to continue at this time  Nicotine Use Disorder -Nicoderm 14 mg for NRT  Medical Problems Nutritional Support -Ensure BID  Chin sutures -- Dr. Renaldo Fiddler and this writer successfully removed 5 blue sutures from patient's  lower chin on 10/14  PRNs Tylenol for pain Maalox for indigestion Hydroxyzine 3 times daily for anxiety Milk of magnesia for mild constipation Trazodone for insomnia  3. Safety and Monitoring: Involuntary admission to inpatient psychiatric unit for safety, stabilization and treatment Daily contact with patient to assess and evaluate symptoms and progress in treatment Patient's case to be discussed in multi-disciplinary team meeting Observation Level : q15 minute checks Vital signs: q12 hours Precautions: suicide, elopement, and assault   4. Discharge Planning: Social work and case management to assist with discharge planning and identification of hospital follow-up needs prior to discharge Estimated LOS: 5-7 days Discharge Concerns: Need to establish a safety plan; Medication compliance and effectiveness Discharge Goals: Return home with outpatient referrals for mental health follow-up including medication management/psychotherapy  Park Pope, MD 05/27/2021, 7:12 AM

## 2021-05-27 NOTE — Progress Notes (Signed)
Pt did not attend orientation/goals group. 

## 2021-05-27 NOTE — Progress Notes (Signed)
Patient has been calm this shift interacting well with peers and staff. C/O anxiety at 2104 PRN atarax 25 mg PO given and effective. Support and encouragement provided as needed. No adverse drug noted. Denies SI/HI/A/VH and verbally contracted for safety.

## 2021-05-28 MED ORDER — THIAMINE HCL 100 MG PO TABS
100.0000 mg | ORAL_TABLET | Freq: Every day | ORAL | Status: DC
  Filled 2021-05-28 (×2): qty 7

## 2021-05-28 MED ORDER — HYDROXYZINE HCL 25 MG PO TABS: 25.0000 mg | ORAL_TABLET | Freq: Three times a day (TID) | ORAL | 0 refills | Status: DC | PRN

## 2021-05-28 MED ORDER — GABAPENTIN 100 MG PO CAPS: 100.0000 mg | ORAL_CAPSULE | Freq: Three times a day (TID) | ORAL | 0 refills | Status: DC

## 2021-05-28 MED ORDER — TRAZODONE HCL 50 MG PO TABS: 50.0000 mg | ORAL_TABLET | Freq: Every evening | ORAL | 0 refills | Status: DC | PRN

## 2021-05-28 MED ORDER — RISPERIDONE 1 MG PO TABS: 1.0000 mg | ORAL_TABLET | Freq: Every day | ORAL | 0 refills | Status: DC

## 2021-05-28 NOTE — Progress Notes (Signed)
Pt didn't attend orientation/goals group. 

## 2021-05-28 NOTE — Progress Notes (Signed)
Discharge note: RN met with pt and reviewed pt's discharge instructions.  Pt verbalized understanding of discharge instructions and pt did not have any questions. RN reviewed and provided pt with a copy of SRA, AVS and Transition Record.  RN returned pt's belongings to pt.  Prescriptions and samples were given to pt. Pt denied SI/HI/AVH and voiced no concerns. Pt was appreciative of the care pt received at [facility]. Patient discharged to the lobby without incident.

## 2021-05-28 NOTE — Progress Notes (Signed)
   05/27/21 2104  Psych Admission Type (Psych Patients Only)  Admission Status Involuntary  Psychosocial Assessment  Patient Complaints Irritability  Eye Contact Fair;Brief  Facial Expression Anxious  Affect Appropriate to circumstance  Speech Logical/coherent  Interaction Minimal;Guarded  Motor Activity Restless  Appearance/Hygiene In scrubs  Behavior Characteristics Cooperative;Appropriate to situation  Mood Pleasant  Thought Process  Coherency Concrete thinking  Content Blaming others  Delusions None reported or observed  Perception WDL  Hallucination None reported or observed  Judgment Poor  Confusion None  Danger to Self  Current suicidal ideation? Denies  Danger to Others  Danger to Others None reported or observed

## 2021-05-28 NOTE — Discharge Summary (Addendum)
Physician Discharge Summary Note  Patient:  Ryan Stark is an 35 y.o., male MRN:  [id number] DOB:  01-Mar-1986 Patient phone:  [phone] (home)  Patient address:   [address] [place] [address],  Total Time spent with patient:  I personally spent 60 minutes on the unit in direct patient care. The direct patient care time included face-to-face time with the patient, reviewing the patient's chart, communicating with other professionals, and coordinating care. Greater than 50% of this time was spent in counseling or coordinating care with the patient regarding goals of hospitalization, psycho-education, and discharge planning needs.   Date of Admission:  05/22/2021 Date of Discharge: 05/28/2021  Reason for Admission:  Patient is a 35 year old male who was admitted to [facility] for stabilization and medication management after a 3 day hospitalization for alcohol withdrawal seizure.  PER H&P Patient is a 35 year old male who was admitted to [facility] for stabilization and medication management after a 3 day hospitalization for alcohol withdrawal seizure. Patient has a psychiatric history significant for alcohol abuse and depression. Patient stated he has been drinking heavily for a long time. He stated he stopped in January but started drinking again in July. He lives with his girlfriend who is concerned about his drinking. He is currently unemployed. He was studying networking but stopped due to no money to pay for the courses. He is from [place] and [place]. His mother is still in [place] and his father lives here. He has no children. Apparently she was at home with him when she heard a thud and found him lying on the floor having a seizure that lasted about 1 minute. He had hit his chin on the way down and has sutures on the right side just below his jaw line. His girlfriend took him to [facility] in [place] and they recommended he go to the ER for laceration repair  and alcohol withdrawal. He went to [facility] and was admitted to the medical floor from the ED.  He denies he is depressed or anxious. He stated he drinks 3 beers a day and 1/4 of a bottle of Gin. He stated he went to [facility] last month, 9-24 to 9-27 for alcohol detox. He stated he was discharged with some homework and wants to go home to complete that. He is asking to be discharged today. He is agreeable to stay until collateral can be obtained regarding the events leading up to his admission. There is some discrepancy in the notes as to whether he had a rope and was trying to hang himself or if he had been drinking and passed out. He stated he felt dizzy and passed out. He stated he did try to hang himself in December but he was not hospitalized for that. 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 This was discussed with him and he was strongly encouraged to cease drinking. He stated that sometimes his girlfriend thinks he is drunk when he is not. This was discussed as well and explained that this is likely due to the brain shrinkage he has caused with alcohol. He does seem to have some difficulty remembering dates and time spans of events in his life. He is calm and cooperative and his thought process is linear and coherent. He has an accent which makes it difficult to understand him at times. He stated he took Prozac for depression a long time ago  but denies depression today. He denies anxiety. He denies homicidal ideation, paranoia and delusions. He does not appear to be responding to internal stimuli. He is able to contract for safety on the unit. We will revisit medications once we obtain collateral for family. Currently patient is denying any depressive symptoms and does not appear to be psychotic.   Principal Problem: Psychosis [facility]) Discharge Diagnoses: Principal Problem:   Psychosis (HCC) Active Problems:   ETOH abuse   Seizure (HCC)   Substance induced mood disorder  (HCC)   Past Psychiatric History: see H&P  Past Medical History:  Past Medical History:  Diagnosis Date   Alcohol abuse    Cervical spine fracture (HCC) 08/19/2016    Past Surgical History:  Procedure Laterality Date   ANTERIOR CERVICAL DECOMP/DISCECTOMY FUSION N/A 08/20/2016   Procedure: ANTERIOR CERVICAL DECOMPRESSION/DISCECTOMY FUSION CERVICAL FOUR - CERVICAL FIVE;  Surgeon: Shirlean Kelly, MD;  Location: [facility] OR;  Service: Neurosurgery;  Laterality: N/A;  ANTERIOR CERVICAL DECOMPRESSION/DISCECTOMY FUSION CERVICAL FOUR - CERVICAL FIVE   Family History: History reviewed. No pertinent family history. Family Psychiatric  History: see H&P Social History:  Social History   Substance and Sexual Activity  Alcohol Use Yes   Comment: Heavy. Binge drinking     Social History   Substance and Sexual Activity  Drug Use Not Currently   Types: Marijuana   Comment: denies current use    Social History   Socioeconomic History   Marital status: Single    Spouse name: Not on file   Number of children: Not on file   Years of education: Not on file   Highest education level: Not on file  Occupational History   Not on file  Tobacco Use   Smoking status: Every Day    Types: Cigarettes   Smokeless tobacco: Never  Vaping Use   Vaping Use: Never used  Substance and Sexual Activity   Alcohol use: Yes    Comment: Heavy. Binge drinking   Drug use: Not Currently    Types: Marijuana    Comment: denies current use   Sexual activity: Not on file  Other Topics Concern   Not on file  Social History Narrative   Not on file   Social Determinants of Health   Financial Resource Strain: Low Risk    Difficulty of Paying Living Expenses: Not hard at all  Food Insecurity: No Food Insecurity   Worried About Programme researcher, broadcasting/film/video in the Last Year: Never true   Ran Out of Food in the Last Year: Never true  Transportation Needs: No Transportation Needs   Lack of Transportation (Medical): No    Lack of Transportation (Non-Medical): No  Physical Activity: Inactive   Days of Exercise per Week: 0 days   Minutes of Exercise per Session: 0 min  Stress: Stress Concern Present   Feeling of Stress : To some extent  Social Connections: Moderately Isolated   Frequency of Communication with Friends and Family: More than three times a week   Frequency of Social Gatherings with Friends and Family: Once a week   Attends Religious Services: Never   Database administrator or Organizations: No   Attends Banker Meetings: Never   Marital Status: Living with partner    Hospital Course:   After the above admission evaluation, patient's presenting symptoms were noted. Patient was recommended for antipsychotic treatment. The medication regimen targeting those presenting symptoms were discussed with patient & initiated with patient's consent. Patient was continued  on Risperdal 1 mg qhs.  Due to patient's increased anxiety as well as alcohol craving patient was started on gabapentin 100 3 times daily.  Patient had no complaints throughout the hospitalization and felt that the medication regiment was appropriate. Patient endorsed improved mood and reduced alcohol craving.  Pertinent labs drawn during hospitalizations include: Normal COVID PCR, capillary glucose 102, A1c 5.6, lipid panel WNL as needed LDL cholesterol 102, prolactin 9.3, normal vitamin B12, normal TSH.   During the course of patient's hospitalization, the 15-minute checks were adequate to ensure patient's safety. Patient did not exhibit erratic or aggressive behavior and was compliant with scheduled medication.  Patient did have an angry outburst when a nurse walked into the bathroom when he was using which required Zyprexa Zydis 10 mg.  Patient was recommended for outpatient psychiatry and therapy.  Patient refused substance abuse outpatient referrals.  At the time of discharge patient is not reporting any acute suicidal/homicidal  ideations/AVH, delusional thoughts or paranoia. Patient did not appear to be responding to any internal stimuli. Patient feels more confident about self-care & in managing their mental health problems. Patient currently denies any new issues or concerns. Education and supportive counseling provided throughout patient's hospital stay & upon discharge.   Today upon discharge evaluation with the attending psychiatrist Dr. Mason Jim, patient's mood is " good". Patient denies any specific concerns. Patient slept well, appetite good, regular bowel movements. Patient denies any physical complaints. Patient feels that the medications have been helpful & is in agreement to continue current treatment regimen as recommended. Patient was able to engage in safety planning including plan to return to [facility] or contact emergency services if patient feels unable to maintain their own safety or the safety of others. Patient had no further questions, comments, or concerns. Patient left [facility] with all personal belongings in no apparent distress. Transportation per safe transport to homeless shelter was arranged for patient.  Physical Findings: AIMS: Facial and Oral Movements Muscles of Facial Expression: None, normal Lips and Perioral Area: None, normal Jaw: None, normal Tongue: None, normal,Extremity Movements Upper (arms, wrists, hands, fingers): None, normal Lower (legs, knees, ankles, toes): None, normal, Trunk Movements Neck, shoulders, hips: None, normal, Overall Severity Severity of abnormal movements (highest score from questions above): None, normal Incapacitation due to abnormal movements: None, normal Patient's awareness of abnormal movements (rate only patient's report): No Awareness, Dental Status Current problems with teeth and/or dentures?: Yes Does patient usually wear dentures?: No  CIWA:  CIWA-Ar Total: 0  Musculoskeletal: Strength & Muscle Tone: within normal limits Gait & Station: normal Patient  leans: N/A   Psychiatric Specialty Exam:  Presentation  General Appearance: Appropriate for Environment; Casual  Eye Contact:Good  Speech:Clear and Coherent; Normal Rate  Speech Volume:Normal  Handedness:Right   Mood and Affect  Mood:Euthymic  Affect:Appropriate; Congruent   Thought Process  Thought Processes:Coherent; Goal Directed; Linear  Descriptions of Associations:Intact  Orientation:Full (Time, Place and Person)  Thought Content:Logical  History of Schizophrenia/Schizoaffective disorder:No  Duration of Psychotic Symptoms:No data recorded Hallucinations:Hallucinations: None  Ideas of Reference:None  Suicidal Thoughts:Suicidal Thoughts: No  Homicidal Thoughts:Homicidal Thoughts: No   Sensorium  Memory:Immediate Good; Recent Good; Remote Good  Judgment:Good  Insight:Good   Executive Functions  Concentration:Good  Attention Span:Good  Recall:Good  Fund of Knowledge:Good  Language:Good   Psychomotor Activity  Psychomotor Activity:Psychomotor Activity: Normal   Assets  Assets:Communication Skills; Desire for Improvement; Physical Health; Resilience; Social Support   Sleep  Sleep:Sleep: Good Number of Hours of Sleep:  7.5    Physical Exam: Physical Exam Vitals and nursing note reviewed.  Constitutional:      Appearance: Normal appearance. He is normal weight.  HENT:     Head: Normocephalic and atraumatic.  Pulmonary:     Effort: Pulmonary effort is normal.  Neurological:     General: No focal deficit present.     Mental Status: He is oriented to person, place, and time.   Review of Systems  Respiratory:  Negative for shortness of breath.   Cardiovascular:  Negative for chest pain.  Gastrointestinal:  Negative for abdominal pain, constipation, diarrhea, heartburn, nausea and vomiting.  Neurological:  Negative for headaches.  Blood pressure 120/74, pulse 88, temperature 98.2 F (36.8 C), temperature source Oral, resp. rate  16, height 5\' 9"  (1.753 m), weight 59.9 kg, SpO2 100 %. Body mass index is 19.49 kg/m.   Social History   Tobacco Use  Smoking Status Every Day   Types: Cigarettes  Smokeless Tobacco Never   Tobacco Cessation:  A prescription for an FDA-approved tobacco cessation medication provided at discharge   Blood Alcohol level:  Lab Results  Component Value Date   ETH <10 05/19/2021   ETH 363 (HH) 05/31/2018    Metabolic Disorder Labs:  Lab Results  Component Value Date   HGBA1C 5.6 05/22/2021   MPG 114.02 05/22/2021   Lab Results  Component Value Date   PROLACTIN 9.3 05/21/2021   PROLACTIN 36.7 (H) 05/20/2021   Lab Results  Component Value Date   CHOL 171 05/22/2021   TRIG 81 05/22/2021   HDL 53 05/22/2021   CHOLHDL 3.2 05/22/2021   VLDL 16 05/22/2021   LDLCALC 102 (H) 05/22/2021    See Psychiatric Specialty Exam and Suicide Risk Assessment completed by Attending Physician prior to discharge.  Discharge destination:  Other:  homeless shelter  Is patient on multiple antipsychotic therapies at discharge:  No   Has Patient had three or more failed trials of antipsychotic monotherapy by history:  No  Recommended Plan for Multiple Antipsychotic Therapies: NA   Allergies as of 05/28/2021   No Known Allergies      Medication List     TAKE these medications      Indication  feeding supplement Liqd Take 237 mLs by mouth 2 (two) times daily between meals.  Indication: Nutritional Support   folic acid 1 MG tablet Commonly known as: FOLVITE Take 1 tablet (1 mg total) by mouth daily.  Indication: Anemia From Inadequate Folic Acid   gabapentin 100 MG capsule Commonly known as: NEURONTIN Take 1 capsule (100 mg total) by mouth 3 (three) times daily.  Indication: Abuse or Misuse of Alcohol   hydrOXYzine 25 MG tablet Commonly known as: ATARAX/VISTARIL Take 1 tablet (25 mg total) by mouth 3 (three) times daily as needed for anxiety.  Indication: Feeling Anxious    multivitamin with minerals Tabs tablet Take 1 tablet by mouth daily.  Indication: Nutritional Support   nicotine 14 mg/24hr patch Commonly known as: NICODERM CQ - dosed in mg/24 hours Place 1 patch (14 mg total) onto the skin daily.  Indication: Nicotine Addiction   risperiDONE 1 MG tablet Commonly known as: RISPERDAL Take 1 tablet (1 mg total) by mouth at bedtime.  Indication: Major Depressive Disorder   thiamine 100 MG tablet Take 1 tablet (100 mg total) by mouth daily.  Indication: Deficiency of Vitamin B1   traZODone 50 MG tablet Commonly known as: DESYREL Take 1 tablet (50 mg total) by mouth at  bedtime as needed for sleep.  Indication: Trouble Sleeping        Follow-up Information     [facility]. Go on 05/29/2021.   Why: You have a hospital follow up appointment for medication management services on 05/29/21 at  8:30 am. This appointment will be held in person.  Therapy services are also available. Contact information: [address] [place] [address] [phone]         [facility]. Schedule an appointment as soon as possible for a visit.   Why: Please call to schedule an appointment with this provider for primary care services as soon as possible. Contact information: [address], [address] [place] [address]                 Follow-up recommendations:   Activity:  as tolerated Diet:  heart healthy   Comments:  Prescriptions were given at discharge.  Patient is agreeable with the discharge plan.  He was given an opportunity to ask questions.  He appears to feel comfortable with discharge and denies any current suicidal or homicidal thoughts.    Patient is instructed prior to discharge to: Take all medications as prescribed by his mental healthcare provider. Report any adverse effects and or reactions from the medicines to his outpatient provider promptly. In the event of worsening  symptoms, patient is instructed to call the crisis hotline, 911 and or go to the nearest ED for appropriate evaluation and treatment of symptoms. Patient is to follow-up with his primary care provider for other medical issues, concerns and or health care needs.   Signed: Park Pope, MD 05/28/2021, 1:44 PM

## 2021-05-28 NOTE — Progress Notes (Signed)
Recreation Therapy Notes  INPATIENT RECREATION TR PLAN  Patient Details Name: Ryan Stark MRN: [id number] DOB: [birth year] Today's Date: 05/28/2021  Rec Therapy Plan Is patient appropriate for Therapeutic Recreation?: Yes Treatment times per week: about 3 days Estimated Length of Stay: 5-7 days TR Treatment/Interventions: Group participation (Comment)  Discharge Criteria Pt will be discharged from therapy if:: Discharged Treatment plan/goals/alternatives discussed and agreed upon by:: Patient/family  Discharge Summary Short term goals set: See patient care plan Short term goals met: Complete Progress toward goals comments: Groups attended Which groups?: Self-esteem, Coping skills, Other (Comment) (Team Building) Reason goals not met: None Therapeutic equipment acquired: N/A Reason patient discharged from therapy: Discharge from hospital Pt/family agrees with progress & goals achieved: Yes Date patient discharged from therapy: 05/28/21    Victorino Sparrow, LRT/CTRS Ria Comment, Bernal Luhman A 05/28/2021, 2:38 PM

## 2021-05-28 NOTE — Plan of Care (Signed)
Patient was able to identify coping skills at completion of recreation therapy group sessions.   Shareena Nusz, LRT/CTRS 

## 2021-05-28 NOTE — Progress Notes (Signed)
  [facility] Adult Case Management Discharge Plan :  Will you be returning to the same living situation after discharge:  No. Will be discharged to shelter At discharge, do you have transportation home?: No. Safe Transport will be arranged Do you have the ability to pay for your medications: No. Samples to be provided at discharge  Release of information consent forms completed and in the chart;  Patient's signature needed at discharge.  Patient to Follow up at:  Follow-up Information     [facility]. Go on 05/29/2021.   Why: You have a hospital follow up appointment for medication management services on 05/29/21 at  8:30 am. This appointment will be held in person.  Therapy services are also available. Contact information: [address] [place] [address] [phone]         [facility]. Schedule an appointment as soon as possible for a visit.   Why: Please call to schedule an appointment with this provider for primary care services as soon as possible. Contact information: [address] [place] [address]                Next level of care provider has access to [facility] and Suicide Prevention discussed: Yes,  with father     Has patient been referred to the Quitline?: Patient refused referral  Patient has been referred for addiction treatment: Pt. refused referral  Otelia Santee, LCSW 05/28/2021, 11:29 AM

## 2021-05-28 NOTE — [facility] Suicide Risk Assessment (Signed)
[facility] Discharge Suicide Risk Assessment   Principal Problem: Schizophrenia spectrum disorder with psychotic disorder type not yet determined [facility] Discharge Diagnoses: Principal Problem:   Schizophrenia spectrum disorder with psychotic disorder type not yet determined (HCC) Active Problems:   ETOH abuse   Seizure (HCC)   Substance induced mood disorder (HCC)  Total Time Spent in Direct Patient Care:  I personally spent 35 minutes on the unit in direct patient care. The direct patient care time included face-to-face time with the patient, reviewing the patient's chart, communicating with other professionals, and coordinating care. Greater than 50% of this time was spent in counseling or coordinating care with the patient regarding goals of hospitalization, psycho-education, and discharge planning needs.  Subjective: Patient was seen on rounds with Automotive engineer. He states he will go to a shelter today and will try to reach his father to also see about housing after discharge. He denies SI, HI, AVH, paranoia, delusions, first rank symptoms, or ideas of reference. He denies depressed or anxious mood and states he feels stable at this time. He denies medication side-effects and voices no physical complaints. He reports good sleep and appetite. He was reminded to watch for galactorrhea while on Risperdal and that he will need ongoing monitoring of AIMS, metabolic labs, weight, EKG and CBC while on this medication. He was advised that his prolactin was elevated and has returned to normal limits but should be monitored while on Risperdal. He declines SAIOP or residential rehab and was encouraged to abstain from alcohol and illicit substance use. He was encouraged to start SA outpatient counseling after discharge. He denies current cravings or signs of withdrawal from substances. Time was given for questions.   Musculoskeletal: Strength & Muscle Tone: within normal limits Gait & Station: normal Patient  leans: N/A  Psychiatric Specialty Exam: Physical Exam Vitals reviewed.  HENT:     Head: Normocephalic.  Pulmonary:     Effort: Pulmonary effort is normal.  Neurological:     General: No focal deficit present.     Mental Status: He is alert.    Review of Systems  Respiratory:  Negative for shortness of breath.   Cardiovascular:  Negative for chest pain.  Gastrointestinal:  Negative for diarrhea, nausea and vomiting.   Blood pressure 120/74, pulse 88, temperature 98.2 F (36.8 C), temperature source Oral, resp. rate 16, height 5\' 9"  (1.753 m), weight 59.9 kg, SpO2 100 %.Body mass index is 19.49 kg/m.  General Appearance:  casually dressed, fair hygiene  Eye Contact:  Good  Speech:  Clear and Coherent and Normal Rate  Volume:  Normal  Mood:  Euthymic  Affect:   moderate, stable  Thought Process:  Goal Directed and Linear  Orientation:  Full (Time, Place, and Person)  Thought Content:  Logical, denies AVH, paranoia, delusions, ideas of reference of first rank symptoms  Suicidal Thoughts:  No  Homicidal Thoughts:  No  Memory:  Recent;   Good  Judgement:  Fair  Insight:  Fair  Psychomotor Activity:  Normal, no tremor or cogwheeling  Concentration:  Concentration: Good and Attention Span: Good  Recall:  Good  Fund of Knowledge:  Good  Language:  Good  Akathisia:  Negative  AIMS (if indicated):   0  Assets:  Communication Skills Desire for Improvement Resilience  ADL's:  Intact  Cognition:  WNL  Sleep:  Number of Hours: 7.5   Mental Status Per Nursing Assessment::   On Admission:  psychosis - resolved  Demographic Factors:  Male,  Low socioeconomic status, and Unemployed  Loss Factors: Decrease in vocational status, Loss of significant relationship, and Financial problems/change in socioeconomic status  Historical Factors: Prior suicide attempts, substance abuse prior to admission  Risk Reduction Factors:   Positive social support and Positive coping skills or  problem solving skills, sense of responsibility to family  Continued Clinical Symptoms:  Alcohol/Substance Abuse/Dependencies More than one psychiatric diagnosis Previous Psychiatric Diagnoses and Treatments  Cognitive Features That Contribute To Risk:  Thought constriction (tunnel vision)    Suicide Risk:  Mild:  There are no identifiable plans, no associated intent, mild dysphoria and related symptoms, few other risk factors, and identifiable protective factors, including available and accessible social support.   Follow-up Information     [facility]. Go on 05/29/2021.   Why: You have a hospital follow up appointment for medication management services on 05/29/21 at  8:30 am. This appointment will be held in person.  Therapy services are also available. Contact information: [address] [place] [address] [phone]                 Plan Of Care/Follow-up recommendations:  Activity:  as tolerated Diet:  heart healthy Other:  Patient advised to abstain from alcohol and illicit substances after discharge. He was encouraged to comply with medications and to keep scheduled mental health and substance abuse/therapy appointments after discharge. He was reminded he will need ongoing monitoring of his glucose, weight, CBC, EKG, AIMS, and lipids while on Risperdal. He was advised that his Prolactin level was mildly elevated and needs to be repeated by a primary care provider in the next 2-3 weeks and monitored to make sure it is not elevated again while he is on Risperdal. He was advised to watch for galactorrhea on Risperdal.  Comer Locket, MD, FAPA 05/28/2021, 7:24 AM

## 2021-05-28 NOTE — Group Note (Signed)
LCSW Group Therapy Note   Group Date: 05/28/2021 Start Time: 1300 End Time: 1400  Type of Therapy and Topic:  Group Therapy:  Setting Goals  Participation Level:  Did Not Attend  Description of Group: In this process group, patients discussed using strengths to work toward goals and address challenges.  Patients identified two positive things about themselves and one goal they were working on.  Patients were given the opportunity to share openly and support each other's plan for self-empowerment.  The group discussed the value of gratitude and were encouraged to have a daily reflection of positive characteristics or circumstances.  Patients were encouraged to identify a plan to utilize their strengths to work on current challenges and goals.  Therapeutic Goals Patient will verbalize personal strengths/positive qualities and relate how these can assist with achieving desired personal goals Patients will verbalize affirmation of peers plans for personal change and goal setting Patients will explore the value of gratitude and positive focus as related to successful achievement of goals Patients will verbalize a plan for regular reinforcement of personal positive qualities and circumstances.  Summary of Patient Progress:   This group was supplemented with worksheets due to complicated discharge.   Therapeutic Modalities Cognitive Behavioral Therapy Motivational Interviewing    Talibah Colasurdo MSW, LCSW Clincal Social Worker  [facility]  

## 2021-05-28 NOTE — Group Note (Signed)
Recreation Therapy Group Note   Group Topic:Problem Solving  Group Date: 05/28/2021 Start Time: 1006 End Time: 1050 Facilitators: Caroll Rancher, LRT/CTRS Location: [address] Dayroom  Goal Area(s) Addresses:  Patient will effectively work with peer towards shared goal.  Patient will identify factors that guided their decision making.  Patient will pro-socially communicate ideas during group session.    Group Description: Patients were given a scenario that they were going to be stranded on a deserted [place] for several months before being rescued. Writer tasked them with making a list of 15 things they would choose to bring with them for "survival". The list of items was prioritized most important to least. Each patient would come up with their own list, then work together to create a new list of 15 items while in a group of 3-5 peers. LRT discussed each person's list and how it differed from others. The debrief included discussion of priorities, good decisions versus bad decisions, and how it is important to think before acting so we can make the best decision possible. LRT tied the concept of effective communication among group members to patient's support systems outside of the hospital and its benefit post discharge.   Affect/Mood: N/A   Participation Level: Did not attend    Clinical Observations/Individualized Feedback: Pt did not attend group.     Plan: Continue to engage patient in RT group sessions 2-3x/week.   Caroll Rancher, LRT/CTRS 05/28/2021 2:09 PM

## 2021-06-24 ENCOUNTER — Ambulatory Visit (HOSPITAL_COMMUNITY)
Admission: EM | Admit: 2021-06-24 | Discharge: 2021-06-24 | Disposition: A | Discharge status: Home / Self Care | Payer: No Payment, Other | Attending: Nurse Practitioner | Admitting: Nurse Practitioner

## 2021-06-24 ENCOUNTER — Other Ambulatory Visit: Payer: Self-pay

## 2021-06-24 ENCOUNTER — Ambulatory Visit (HOSPITAL_COMMUNITY)
Admission: AD | Admit: 2021-06-24 | Discharge: 2021-06-24 | Disposition: A | Discharge status: Home / Self Care | Payer: Federal, State, Local not specified - Other | Attending: Emergency Medicine | Admitting: Emergency Medicine

## 2021-06-24 DIAGNOSIS — F419 Anxiety disorder, unspecified: Secondary | ICD-10-CM | POA: Insufficient documentation

## 2021-06-24 DIAGNOSIS — F32A Depression, unspecified: Secondary | ICD-10-CM | POA: Insufficient documentation

## 2021-06-24 DIAGNOSIS — Z79899 Other long term (current) drug therapy: Secondary | ICD-10-CM | POA: Insufficient documentation

## 2021-06-24 DIAGNOSIS — G40909 Epilepsy, unspecified, not intractable, without status epilepticus: Secondary | ICD-10-CM | POA: Insufficient documentation

## 2021-06-24 DIAGNOSIS — Z20822 Contact with and (suspected) exposure to covid-19: Secondary | ICD-10-CM | POA: Insufficient documentation

## 2021-06-24 DIAGNOSIS — G4700 Insomnia, unspecified: Secondary | ICD-10-CM | POA: Insufficient documentation

## 2021-06-24 DIAGNOSIS — F1914 Other psychoactive substance abuse with psychoactive substance-induced mood disorder: Secondary | ICD-10-CM | POA: Insufficient documentation

## 2021-06-24 DIAGNOSIS — F17210 Nicotine dependence, cigarettes, uncomplicated: Secondary | ICD-10-CM | POA: Insufficient documentation

## 2021-06-24 DIAGNOSIS — F1010 Alcohol abuse, uncomplicated: Secondary | ICD-10-CM | POA: Insufficient documentation

## 2021-06-24 DIAGNOSIS — F1290 Cannabis use, unspecified, uncomplicated: Secondary | ICD-10-CM | POA: Insufficient documentation

## 2021-06-24 LAB — TSH: TSH: 0.922 u[IU]/mL (ref 0.350–4.500)

## 2021-06-24 LAB — RESP PANEL BY RT-PCR (FLU A&B, COVID) ARPGX2
Influenza A by PCR: NEGATIVE
Influenza B by PCR: NEGATIVE
SARS Coronavirus 2 by RT PCR: NEGATIVE

## 2021-06-24 LAB — COMPREHENSIVE METABOLIC PANEL
ALT: 26 U/L (ref 0–44)
AST: 56 U/L — ABNORMAL HIGH (ref 15–41)
Albumin: 4.2 g/dL (ref 3.5–5.0)
Alkaline Phosphatase: 72 U/L (ref 38–126)
Anion gap: 13 (ref 5–15)
BUN: 5 mg/dL — ABNORMAL LOW (ref 6–20)
CO2: 24 mmol/L (ref 22–32)
Calcium: 9.7 mg/dL (ref 8.9–10.3)
Chloride: 95 mmol/L — ABNORMAL LOW (ref 98–111)
Creatinine, Ser: 0.97 mg/dL (ref 0.61–1.24)
GFR, Estimated: >60 mL/min (ref >60)
Glucose, Bld: 68 mg/dL — ABNORMAL LOW (ref 70–99)
Potassium: 4.2 mmol/L (ref 3.5–5.1)
Sodium: 132 mmol/L — ABNORMAL LOW (ref 135–145)
Total Bilirubin: 1.6 mg/dL — ABNORMAL HIGH (ref 0.3–1.2)
Total Protein: 6.7 g/dL (ref 6.5–8.1)

## 2021-06-24 LAB — HEMOGLOBIN A1C
Hgb A1c MFr Bld: 5.7 % — ABNORMAL HIGH (ref 4.8–5.6)
Mean Plasma Glucose: 116.89 mg/dL

## 2021-06-24 LAB — POCT URINE DRUG SCREEN - MANUAL ENTRY (I-SCREEN)
POC Amphetamine UR: NOT DETECTED
POC Buprenorphine (BUP): NOT DETECTED
POC Cocaine UR: NOT DETECTED
POC Marijuana UR: POSITIVE — AB
POC Methadone UR: NOT DETECTED
POC Methamphetamine UR: NOT DETECTED
POC Morphine: NOT DETECTED
POC Oxazepam (BZO): NOT DETECTED
POC Oxycodone UR: NOT DETECTED
POC Secobarbital (BAR): NOT DETECTED

## 2021-06-24 LAB — CBC WITH DIFFERENTIAL/PLATELET
Abs Immature Granulocytes: 0.01 10*3/uL (ref 0.00–0.07)
Basophils Absolute: 0 10*3/uL (ref 0.0–0.1)
Basophils Relative: 0 %
Eosinophils Absolute: 0.1 10*3/uL (ref 0.0–0.5)
Eosinophils Relative: 1 %
HCT: 40.2 % (ref 39.0–52.0)
Hemoglobin: 14 g/dL (ref 13.0–17.0)
Immature Granulocytes: 0 %
Lymphocytes Relative: 27 %
Lymphs Abs: 1.8 10*3/uL (ref 0.7–4.0)
MCH: 31.1 pg (ref 26.0–34.0)
MCHC: 34.8 g/dL (ref 30.0–36.0)
MCV: 89.3 fL (ref 80.0–100.0)
Monocytes Absolute: 0.9 10*3/uL (ref 0.1–1.0)
Monocytes Relative: 13 %
Neutro Abs: 3.9 10*3/uL (ref 1.7–7.7)
Neutrophils Relative %: 59 %
Platelets: 121 10*3/uL — ABNORMAL LOW (ref 150–400)
RBC: 4.5 MIL/uL (ref 4.22–5.81)
RDW: 13.4 % (ref 11.5–15.5)
WBC: 6.7 10*3/uL (ref 4.0–10.5)
nRBC: 0 % (ref 0.0–0.2)

## 2021-06-24 LAB — POC SARS CORONAVIRUS 2 AG -  ED: SARS Coronavirus 2 Ag: NEGATIVE

## 2021-06-24 LAB — ETHANOL: Alcohol, Ethyl (B): <10 mg/dL (ref <10)

## 2021-06-24 MED ORDER — HYDROXYZINE HCL 25 MG PO TABS: 25.0000 mg | ORAL_TABLET | Freq: Three times a day (TID) | ORAL | Status: DC | PRN

## 2021-06-24 MED ORDER — MAGNESIUM HYDROXIDE 400 MG/5ML PO SUSP: 30.0000 mL | Freq: Every day | ORAL | Status: DC | PRN

## 2021-06-24 MED ORDER — ALUM & MAG HYDROXIDE-SIMETH 200-200-20 MG/5ML PO SUSP: 30.0000 mL | ORAL | Status: DC | PRN

## 2021-06-24 MED ORDER — TRAZODONE HCL 50 MG PO TABS: 50.0000 mg | ORAL_TABLET | Freq: Every evening | ORAL | Status: DC | PRN

## 2021-06-24 MED ORDER — ACETAMINOPHEN 325 MG PO TABS: 650.0000 mg | ORAL_TABLET | Freq: Four times a day (QID) | ORAL | Status: DC | PRN

## 2021-06-24 NOTE — ED Notes (Signed)
Patient complains of dizziness and loss of appetite. Asked for orange juice.
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
## 2021-06-24 NOTE — ED Provider Notes (Signed)
FBC/OBS ASAP Discharge Summary  Date and Time: 06/24/2021 12:09 PM  Name: Ryan Stark  MRN:  [id number]   Discharge Diagnoses:  Final diagnoses:  ETOH abuse    Subjective:  Ryan Stark was seen and evaluated face-to-face.  He is denying suicidal or homicidal ideations.  Denies auditory or visual hallucinations.  States he was recently discharged from behavioral health and would like to be transferred back in order to detox again from alcohol. Patient reported his last drink was " about 1 week ago." Stated that he hasn't taken any medication as directed and is requesting to be transferred to [facility].  Education was provided with following up with resources provided at discharge.  Patient was receptive to plan and requested to call his significant other for discharge.  Support, encouragement and reassurance was provided.   Stay Summary:  Per admission assessment note: Ryan Stark is a 35 y.o. male with a past psychiatric history significant for psychosis, alcohol abuse, and substance induced mood disorder.  Patient also has a past medical history significant for seizure disorder.  Patient presents to [facility] with a chief complaint of being unable to sleep.  Patient was brought over to [facility] by his ex-girlfriend.  Patient reports that he has been unable to sleep for the past 2 days.  Patient currently takes trazodone 50 mg at bedtime but he states that it has not been effective in managing his sleep.  Patient pleads multiple times during the encounter that all he wanted to use to be able to sleep.  He reports that he feels like he is having a seizure.  Patient endorses depression that comes and goes.  He also endorses anxiety.  During the encounter, patient expressed a couple times that he wanted everything to be quiet for roughly 5 minutes.  In addition to trazodone 50 mg at bedtime, patient is also taking gabapentin 100 mg 3 times daily, hydroxyzine 25 mg 3 times daily as  needed, and risperidone 1 mg at bedtime.  Patient reports that he has been taking his medications as prescribed, mostly but states that he might have missed 1 dose.  Total Time spent with patient: 15 minutes  Past Psychiatric History:  Past Medical History:  Past Medical History:  Diagnosis Date   Alcohol abuse    Cervical spine fracture (HCC) 08/19/2016    Past Surgical History:  Procedure Laterality Date   ANTERIOR CERVICAL DECOMP/DISCECTOMY FUSION N/A 08/20/2016   Procedure: ANTERIOR CERVICAL DECOMPRESSION/DISCECTOMY FUSION CERVICAL FOUR - CERVICAL FIVE;  Surgeon: Shirlean Kelly, MD;  Location: [facility] OR;  Service: Neurosurgery;  Laterality: N/A;  ANTERIOR CERVICAL DECOMPRESSION/DISCECTOMY FUSION CERVICAL FOUR - CERVICAL FIVE   Family History: No family history on file. Family Psychiatric History:  Social History:  Social History   Substance and Sexual Activity  Alcohol Use Yes   Comment: Heavy. Binge drinking     Social History   Substance and Sexual Activity  Drug Use Not Currently   Types: Marijuana   Comment: denies current use    Social History   Socioeconomic History   Marital status: Single    Spouse name: Not on file   Number of children: Not on file   Years of education: Not on file   Highest education level: Not on file  Occupational History   Not on file  Tobacco Use   Smoking status: Every Day    Types: Cigarettes   Smokeless tobacco: Never  Vaping Use   Vaping Use: Never used  Substance and Sexual Activity   Alcohol use: Yes    Comment: Heavy. Binge drinking   Drug use: Not Currently    Types: Marijuana    Comment: denies current use   Sexual activity: Not on file  Other Topics Concern   Not on file  Social History Narrative   Not on file   Social Determinants of Health   Financial Resource Strain: Low Risk    Difficulty of Paying Living Expenses: Not hard at all  Food Insecurity: No Food Insecurity   Worried About Charity fundraiser in the  Last Year: Never true   [place] in the Last Year: Never true  Transportation Needs: No Transportation Needs   Lack of Transportation (Medical): No   Lack of Transportation (Non-Medical): No  Physical Activity: Inactive   Days of Exercise per Week: 0 days   Minutes of Exercise per Session: 0 min  Stress: Stress Concern Present   Feeling of Stress : To some extent  Social Connections: Moderately Isolated   Frequency of Communication with Friends and Family: More than three times a week   Frequency of Social Gatherings with Friends and Family: Once a week   Attends Religious Services: Never   Marine scientist or Organizations: No   Attends Music therapist: Never   Marital Status: Living with partner   SDOH:  SDOH Screenings   Alcohol Screen: Medium Risk   Last Alcohol Screening Score (AUDIT): 13  Depression (PHQ2-9): Medium Risk   PHQ-2 Score: 13  Financial Resource Strain: Low Risk    Difficulty of Paying Living Expenses: Not hard at all  Food Insecurity: No Food Insecurity   Worried About Charity fundraiser in the Last Year: Never true   Ran Out of Food in the Last Year: Never true  Housing: Low Risk    Last Housing Risk Score: 0  Physical Activity: Inactive   Days of Exercise per Week: 0 days   Minutes of Exercise per Session: 0 min  Social Connections: Moderately Isolated   Frequency of Communication with Friends and Family: More than three times a week   Frequency of Social Gatherings with Friends and Family: Once a week   Attends Religious Services: Never   Marine scientist or Organizations: No   Attends Archivist Meetings: Never   Marital Status: Living with partner  Stress: Stress Concern Present   Feeling of Stress : To some extent  Tobacco Use: High Risk   Smoking Tobacco Use: Every Day   Smokeless Tobacco Use: Never   Passive Exposure: Not on file  Transportation Needs: No Transportation Needs   Lack of  Transportation (Medical): No   Lack of Transportation (Non-Medical): No    Tobacco Cessation:  A prescription for an FDA-approved tobacco cessation medication provided at discharge  Current Medications:  Current Facility-Administered Medications  Medication Dose Route Frequency Provider Last Rate Last Admin   acetaminophen (TYLENOL) tablet 650 mg  650 mg Oral Q6H PRN White, Patrice L, NP       alum & mag hydroxide-simeth (MAALOX/MYLANTA) 200-200-20 MG/5ML suspension 30 mL  30 mL Oral Q4H PRN White, Patrice L, NP       hydrOXYzine (ATARAX/VISTARIL) tablet 25 mg  25 mg Oral TID PRN White, Patrice L, NP       magnesium hydroxide (MILK OF MAGNESIA) suspension 30 mL  30 mL Oral Daily PRN White, Mellody Life, NP  traZODone (DESYREL) tablet 50 mg  50 mg Oral QHS PRN White, Patrice L, NP       No current outpatient medications on file.    PTA Medications: (Not in a hospital admission)   Musculoskeletal  Strength & Muscle Tone: within normal limits Gait & Station: normal Patient leans: N/A  Psychiatric Specialty Exam  Presentation  General Appearance: Appropriate for Environment; Well Groomed  Eye Contact:Poor  Speech:Clear and Coherent; Normal Rate  Speech Volume:Normal  Handedness:Right   Mood and Affect  Mood:Irritable  Affect:Congruent; Restricted   Thought Process  Thought Processes:Goal Directed; Coherent  Descriptions of Associations:Intact  Orientation:Full (Time, Place and Person)  Thought Content:WDL  Diagnosis of Schizophrenia or Schizoaffective disorder in past: Yes  Duration of Psychotic Symptoms: Less than six months   Hallucinations:Hallucinations: None  Ideas of Reference:None  Suicidal Thoughts:Suicidal Thoughts: No  Homicidal Thoughts:Homicidal Thoughts: No   Sensorium  Memory:Immediate Good; Recent Good; Remote Good  Judgment:Fair  Insight:Fair   Executive Functions  Concentration:Fair  Attention Span:Fair  [place]  Language:Good   Psychomotor Activity  Psychomotor Activity:Psychomotor Activity: Decreased; Restlessness   Assets  Assets:Communication Skills; Desire for Improvement; Social Support   Sleep  Sleep:Sleep: Poor   No data recorded  Physical Exam  Physical Exam Vitals and nursing note reviewed.  Cardiovascular:     Rate and Rhythm: Normal rate and regular rhythm.  Neurological:     General: No focal deficit present.     Mental Status: He is alert and oriented to person, place, and time.  Psychiatric:        Attention and Perception: Attention normal.        Mood and Affect: Mood normal.        Speech: Speech normal.        Behavior: Behavior normal.        Thought Content: Thought content normal.        Cognition and Memory: Cognition normal.   Review of Systems  Cardiovascular: Negative.   Gastrointestinal: Negative.   Neurological:  Positive for dizziness. Seizures: reproted seizures. Psychiatric/Behavioral:  Positive for substance abuse. Negative for depression and suicidal ideas. The patient is nervous/anxious.   All other systems reviewed and are negative. Blood pressure 125/88, pulse 99, temperature 98.3 F (36.8 C), resp. rate 18, SpO2 98 %. There is no height or weight on file to calculate BMI.  Demographic Factors:  Male  Loss Factors: Decline in physical health  Historical Factors: NA  Risk Reduction Factors:   Positive social support and Positive therapeutic relationship  Continued Clinical Symptoms:  Alcohol/Substance Abuse/Dependencies  Cognitive Features That Contribute To Risk:  Closed-mindedness    Suicide Risk:  Minimal: No identifiable suicidal ideation.  Patients presenting with no risk factors but with morbid ruminations; may be classified as minimal risk based on the severity of the depressive symptoms  Plan Of Care/Follow-up recommendations:  Activity:  as tolerated Diet:  heart healthy  Disposition: Take all  medications as prescribed. Keep all follow-up appointments as scheduled.  Do not consume alcohol or use illegal drugs while on prescription medications. Report any adverse effects from your medications to your primary care provider promptly.  In the event of recurrent symptoms or worsening symptoms, call 911, a crisis hotline, or go to the nearest emergency department for evaluation.    Derrill Center, NP 06/24/2021, 12:09 PM

## 2021-06-24 NOTE — ED Notes (Signed)
Discharge instructions provided and Pt stated understanding. Pt alert, orient and ambulatory prior to d/c from facility. No personal belongings to be returned from a locker. Escorted Pt to the front lobby to d/c from facility. Safety maintained.

## 2021-06-24 NOTE — Discharge Instructions (Addendum)
Take all medications as prescribed. Keep all follow-up appointments as scheduled.  Do not consume alcohol or use illegal drugs while on prescription medications. Report any adverse effects from your medications to your primary care provider promptly.  In the event of recurrent symptoms or worsening symptoms, call 911, a crisis hotline, or go to the nearest emergency department for evaluation.   

## 2021-06-24 NOTE — ED Notes (Signed)
Pt given meal. Pt went to use the restroom but did not shut the door while inside. Requested that PT shut the door when using the restroom in the future. Pt complied. Safety maintained and will continue to monitor.

## 2021-06-24 NOTE — ED Notes (Signed)
Patient is alert and oriented. Requested warm water to drink, anxious to lie down as he repeatedly complained of dizziness and weakness in his legs. Currently is resting quietly in bed with eyes closed. Will continue to monitor for safety.

## 2021-06-24 NOTE — H&P (Signed)
Behavioral Health Medical Screening Exam  Ryan Stark is a 35 y.o. male with a past psychiatric history significant for psychosis, alcohol abuse, and substance induced mood disorder.  Patient also has a past medical history significant for seizure disorder.  Patient presents to [facility] with a chief complaint of being unable to sleep.  Patient was brought over to [facility] by his ex-girlfriend.  Patient reports that he has been unable to sleep for the past 2 days.  Patient currently takes trazodone 50 mg at bedtime but he states that it has not been effective in managing his sleep.  Patient pleads multiple times during the encounter that all he wanted to use to be able to sleep.  He reports that he feels like he is having a seizure.  Patient endorses depression that comes and goes.  He also endorses anxiety.  During the encounter, patient expressed a couple times that he wanted everything to be quiet for roughly 5 minutes.  In addition to trazodone 50 mg at bedtime, patient is also taking gabapentin 100 mg 3 times daily, hydroxyzine 25 mg 3 times daily as needed, and risperidone 1 mg at bedtime.  Patient reports that he has been taking his medications as prescribed, mostly but states that he might have missed 1 dose.  Patient denies suicidal or homicidal ideations.  He further denies auditory or visual hallucinations and does not appear to be responding to internal/external stimuli.  Patient endorses poor sleep and was unable to quantify the amount of sleep he receives each night.  Patient endorses decreased appetite stating that he does not eat often.  Patient endorses alcohol consumption stating that he last drank around 5:00.  Patient endorses illicit drug use in the form of marijuana stating that he smokes 2 puffs of weed.  Patient denies use of any other drug paraphernalia.  Adderall was obtained from the patient's ex-girlfriend.  She reports that after the patient was discharged from the  hospital, he was sleeping a lot.  Over time, patient has been engaging in alcohol consumption.  She notes that the patient also speaks to and keeps referring to someone by the name of Richardson Landry.  Patient and his ex-girlfriend were broken on, however, patient still continues to live with her.  She reports that the patient can be aggressive at times but he tends to be mostly nice.  She reports that the patient never made mention that he was going to hurt himself.  Total Time spent with patient: 15 minutes  Psychiatric Specialty Exam:  Presentation  General Appearance: Appropriate for Environment; Well Groomed  Eye Contact:Poor  Speech:Clear and Coherent; Normal Rate  Speech Volume:Normal  Handedness:Right   Mood and Affect  Mood:Irritable  Affect:Congruent; Restricted   Thought Process  Thought Processes:Goal Directed; Coherent  Descriptions of Associations:Intact  Orientation:Full (Time, Place and Person)  Thought Content:WDL  History of Schizophrenia/Schizoaffective disorder:No  Duration of Psychotic Symptoms:No data recorded Hallucinations:Hallucinations: None  Ideas of Reference:None  Suicidal Thoughts:Suicidal Thoughts: No  Homicidal Thoughts:Homicidal Thoughts: No   Sensorium  Memory:Immediate Good; Recent Good; Remote Good  Judgment:Fair  Insight:Fair   Executive Functions  Concentration:Fair  Attention Span:Fair  [place]  Language:Good   Psychomotor Activity  Psychomotor Activity:Psychomotor Activity: Decreased; Restlessness   Assets  Assets:Communication Skills; Desire for Improvement; Social Support   Sleep  Sleep:Sleep: Poor    Physical Exam: Physical Exam Psychiatric:        Attention and Perception: Perception normal. He is inattentive. He  does not perceive auditory or visual hallucinations.        Mood and Affect: Mood is anxious and depressed.        Speech: Speech normal.        Behavior:  Behavior is uncooperative and agitated.        Thought Content: Thought content normal. Thought content does not include homicidal or suicidal ideation. Thought content does not include suicidal plan.        Cognition and Memory: Memory normal. Cognition is impaired.        Judgment: Judgment is inappropriate.   Review of Systems  Psychiatric/Behavioral:  Positive for depression. Negative for hallucinations, substance abuse and suicidal ideas. The patient is nervous/anxious and has insomnia.   Blood pressure 132/80, pulse 99, resp. rate 17, SpO2 99 %. There is no height or weight on file to calculate BMI.  Musculoskeletal: Strength & Muscle Tone: within normal limits Gait & Station: normal Patient leans: N/A   Recommendations:  Based on my evaluation the patient does not appear to have an emergency medical condition.  Patient denies suicidal or homicidal ideations and further denies auditory and visual hallucinations.  During the encounter, patient was easily agitated when questions were asked to him.  Patient is requesting inpatient psychiatric services, however, patient's main issues do not appear to be life-threatening or severe.  Due to patient's girlfriend stated that the patient experiences hallucinations, patient was suggested going to [facility].  Patient to be transferred to [facility] for safe transport.  Report was provided to [employer], NP prior to transport.  Meta Hatchet, PA 06/24/2021, 4:24 AM

## 2021-06-28 ENCOUNTER — Telehealth (HOSPITAL_COMMUNITY): Payer: Self-pay

## 2021-06-28 NOTE — BH Assessment (Signed)
Care Management - Follow Up [facility] Discharges   Writer attempted to make contact with patient today and was unsuccessful.  Writer left a HIPPA compliant voice message.   Left Message.  Per chart review, pt provided with outpatient substance abuse resources
